# Patient Record
Sex: Female | Born: 1965 | Race: Black or African American | Hispanic: No | Marital: Single | Smoking: Former smoker
Health system: Southern US, Community
[De-identification: ages and names within clinical notes are randomized; demographics above are authoritative.]

## PROBLEM LIST (undated history)

## (undated) DIAGNOSIS — E119 Type 2 diabetes mellitus without complications: Secondary | ICD-10-CM

## (undated) DIAGNOSIS — N200 Calculus of kidney: Secondary | ICD-10-CM

## (undated) HISTORY — PX: TUBAL LIGATION: SHX77

## (undated) HISTORY — PX: LITHOTRIPSY: SUR834

---

## 2007-06-19 ENCOUNTER — Ambulatory Visit (HOSPITAL_COMMUNITY): Admission: RE | Admit: 2007-06-19 | Discharge: 2007-06-19 | Payer: Self-pay | Admitting: Family Medicine

## 2010-12-08 ENCOUNTER — Encounter: Payer: Self-pay | Admitting: *Deleted

## 2010-12-08 ENCOUNTER — Emergency Department (HOSPITAL_COMMUNITY)
Admission: EM | Admit: 2010-12-08 | Discharge: 2010-12-09 | Disposition: A | Discharge status: Home / Self Care | Payer: Self-pay | Attending: Emergency Medicine | Admitting: Emergency Medicine

## 2010-12-08 ENCOUNTER — Emergency Department (HOSPITAL_COMMUNITY): Payer: Self-pay

## 2010-12-08 DIAGNOSIS — F17200 Nicotine dependence, unspecified, uncomplicated: Secondary | ICD-10-CM | POA: Insufficient documentation

## 2010-12-08 DIAGNOSIS — R1011 Right upper quadrant pain: Secondary | ICD-10-CM

## 2010-12-08 DIAGNOSIS — R1084 Generalized abdominal pain: Secondary | ICD-10-CM | POA: Insufficient documentation

## 2010-12-08 DIAGNOSIS — R109 Unspecified abdominal pain: Secondary | ICD-10-CM

## 2010-12-08 DIAGNOSIS — R112 Nausea with vomiting, unspecified: Secondary | ICD-10-CM | POA: Insufficient documentation

## 2010-12-08 DIAGNOSIS — N200 Calculus of kidney: Secondary | ICD-10-CM | POA: Insufficient documentation

## 2010-12-08 DIAGNOSIS — R079 Chest pain, unspecified: Secondary | ICD-10-CM | POA: Insufficient documentation

## 2010-12-08 LAB — URINALYSIS, ROUTINE W REFLEX MICROSCOPIC
Glucose, UA: NEGATIVE mg/dL
Ketones, ur: NEGATIVE mg/dL
pH: 6.5 (ref 5.0–8.0)

## 2010-12-08 LAB — COMPREHENSIVE METABOLIC PANEL
Albumin: 3.8 g/dL (ref 3.5–5.2)
BUN: 9 mg/dL (ref 6–23)
Chloride: 103 mEq/L (ref 96–112)
Creatinine, Ser: 0.9 mg/dL (ref 0.50–1.10)
Total Bilirubin: 0.2 mg/dL — ABNORMAL LOW (ref 0.3–1.2)

## 2010-12-08 LAB — DIFFERENTIAL
Basophils Relative: 0 % (ref 0–1)
Eosinophils Absolute: 0.2 10*3/uL (ref 0.0–0.7)
Monocytes Absolute: 0.5 10*3/uL (ref 0.1–1.0)
Monocytes Relative: 5 % (ref 3–12)
Neutro Abs: 5.3 10*3/uL (ref 1.7–7.7)

## 2010-12-08 LAB — CBC
HCT: 37.4 % (ref 36.0–46.0)
Hemoglobin: 12.5 g/dL (ref 12.0–15.0)
MCH: 29.8 pg (ref 26.0–34.0)
MCHC: 33.4 g/dL (ref 30.0–36.0)
MCV: 89 fL (ref 78.0–100.0)

## 2010-12-08 LAB — URINE MICROSCOPIC-ADD ON

## 2010-12-08 LAB — LIPASE, BLOOD: Lipase: 16 U/L (ref 11–59)

## 2010-12-08 MED ORDER — ONDANSETRON HCL 4 MG/2ML IJ SOLN
4.0000 mg | Freq: Once | INTRAMUSCULAR | Status: AC
  Administered 2010-12-08: 4 mg via INTRAVENOUS
  Filled 2010-12-08: qty 2

## 2010-12-08 MED ORDER — SODIUM CHLORIDE 0.9 % IV SOLN
INTRAVENOUS | Status: DC
  Administered 2010-12-08: 22:00:00 via INTRAVENOUS

## 2010-12-08 MED ORDER — MORPHINE SULFATE 4 MG/ML IJ SOLN
4.0000 mg | Freq: Once | INTRAMUSCULAR | Status: AC
  Administered 2010-12-08: 4 mg via INTRAVENOUS
  Filled 2010-12-08: qty 1

## 2010-12-08 MED ORDER — FAMOTIDINE IN NACL 20-0.9 MG/50ML-% IV SOLN
20.0000 mg | Freq: Once | INTRAVENOUS | Status: DC
  Filled 2010-12-08: qty 50

## 2010-12-08 NOTE — ED Provider Notes (Signed)
History    Scribed for Felisa Bonier, MD, the patient was seen in room APA06/APA06. This chart was scribed by Katha Cabal.   CSN: [id number] Arrival date & time: 12/08/2010  8:47 PM   First MD Initiated Contact with Patient 12/08/10 2105      Chief Complaint  Patient presents with  . Abdominal Pain    (Consider location/radiation/quality/duration/timing/severity/associated sxs/prior treatment) HPI Traci Zimmerman is a 45 y.o. female who presents to the Emergency Department complaining of sudden onset of moderate persistent abdominal pain that radiates to back.  Pain began yesterday.   Pain is described as cramping and sharp.  Pain is associated with nausea, mild vomiting and mild chest pain.  Denies diarrhea, SOB and cough.   Patient states that pain was "unbearable today".  Last BM was 2 days ago which is baseline per patient.  Patient denies dysuria or vaginal discharge.  History reviewed. No pertinent past medical history.  Past Surgical History  Procedure Date  . Cesarean section   . Tubal ligation     Family History  Problem Relation Age of Onset  . Diabetes Neg Hx     History  Substance Use Topics  . Smoking status: Current Everyday Smoker  . Smokeless tobacco: Not on file  . Alcohol Use: No    OB History    Grav Para Term Preterm Abortions TAB SAB Ect Mult Living                  Review of Systems 10 Systems reviewed and are negative for acute change except as noted in the HPI.  Allergies  Review of patient's allergies indicates no known allergies.  Home Medications  No current outpatient prescriptions on file.  BP 147/73  Pulse 64  Temp(Src) 98.5 F (36.9 C) (Oral)  Resp 14  Ht 5\' 7"  (1.702 m)  Wt 234 lb (106.142 kg)  BMI 36.65 kg/m2  SpO2 100%  LMP 12/03/2010  Physical Exam  Nursing note and vitals reviewed. Constitutional: She is oriented to person, place, and time. She appears well-developed and well-nourished.  HENT:  Head:  Normocephalic and atraumatic.  Mouth/Throat: Mucous membranes are normal. Mucous membranes are not dry. No posterior oropharyngeal erythema.  Eyes: EOM are normal.  Neck: Neck supple.  Cardiovascular: Normal rate, regular rhythm and normal heart sounds.   No murmur heard. Pulmonary/Chest: Effort normal and breath sounds normal. No respiratory distress. She has no wheezes. She has no rales.  Abdominal: Soft. Bowel sounds are normal. There is tenderness. There is no rebound and no guarding.       Diffuse upper abdominal tenderness   Musculoskeletal: Normal range of motion.  Neurological: She is alert and oriented to person, place, and time. No sensory deficit.  Skin: Skin is warm, dry and intact. No pallor.  Psychiatric: She has a normal mood and affect. Her behavior is normal.    ED Course  Procedures (including critical care time)   DIAGNOSTIC STUDIES: Oxygen Saturation is 100% on room air, normal by my interpretation.    COORDINATION OF CARE:   LABS / RADIOLOGY:  Labs Reviewed  URINALYSIS, ROUTINE W REFLEX MICROSCOPIC - Abnormal; Notable for the following:    Appearance CLOUDY (*)    Hgb urine dipstick LARGE (*)    Bilirubin Urine SMALL (*)    Protein, ur 30 (*)    Leukocytes, UA TRACE (*)    All other components within normal limits  COMPREHENSIVE METABOLIC PANEL - Abnormal; Notable for  the following:    Potassium 3.3 (*)    Glucose, Bld 120 (*)    Calcium 11.9 (*)    Alkaline Phosphatase 194 (*)    Total Bilirubin 0.2 (*)    GFR calc non Af Amer 76 (*)    GFR calc Af Amer 88 (*)    All other components within normal limits  URINE MICROSCOPIC-ADD ON - Abnormal; Notable for the following:    Squamous Epithelial / LPF FEW (*)    Bacteria, UA FEW (*)    Crystals CA OXALATE CRYSTALS (*)    All other components within normal limits  PREGNANCY, URINE  CBC  DIFFERENTIAL  LIPASE, BLOOD   Results for orders placed during the hospital encounter of 12/08/10  PREGNANCY,  URINE      Component Value Range   Preg Test, Ur NEGATIVE    URINALYSIS, ROUTINE W REFLEX MICROSCOPIC      Component Value Range   Color, Urine YELLOW  YELLOW    Appearance CLOUDY (*) CLEAR    Specific Gravity, Urine 1.025  1.005 - 1.030    pH 6.5  5.0 - 8.0    Glucose, UA NEGATIVE  NEGATIVE (mg/dL)   Hgb urine dipstick LARGE (*) NEGATIVE    Bilirubin Urine SMALL (*) NEGATIVE    Ketones, ur NEGATIVE  NEGATIVE (mg/dL)   Protein, ur 30 (*) NEGATIVE (mg/dL)   Urobilinogen, UA 1.0  0.0 - 1.0 (mg/dL)   Nitrite NEGATIVE  NEGATIVE    Leukocytes, UA TRACE (*) NEGATIVE   CBC      Component Value Range   WBC 8.9  4.0 - 10.5 (K/uL)   RBC 4.20  3.87 - 5.11 (MIL/uL)   Hemoglobin 12.5  12.0 - 15.0 (g/dL)   HCT 81.1  91.4 - 78.2 (%)   MCV 89.0  78.0 - 100.0 (fL)   MCH 29.8  26.0 - 34.0 (pg)   MCHC 33.4  30.0 - 36.0 (g/dL)   RDW 95.6  21.3 - 08.6 (%)   Platelets 344  150 - 400 (K/uL)  DIFFERENTIAL      Component Value Range   Neutrophils Relative 59  43 - 77 (%)   Neutro Abs 5.3  1.7 - 7.7 (K/uL)   Lymphocytes Relative 33  12 - 46 (%)   Lymphs Abs 3.0  0.7 - 4.0 (K/uL)   Monocytes Relative 5  3 - 12 (%)   Monocytes Absolute 0.5  0.1 - 1.0 (K/uL)   Eosinophils Relative 2  0 - 5 (%)   Eosinophils Absolute 0.2  0.0 - 0.7 (K/uL)   Basophils Relative 0  0 - 1 (%)   Basophils Absolute 0.0  0.0 - 0.1 (K/uL)  COMPREHENSIVE METABOLIC PANEL      Component Value Range   Sodium 137  135 - 145 (mEq/L)   Potassium 3.3 (*) 3.5 - 5.1 (mEq/L)   Chloride 103  96 - 112 (mEq/L)   CO2 27  19 - 32 (mEq/L)   Glucose, Bld 120 (*) 70 - 99 (mg/dL)   BUN 9  6 - 23 (mg/dL)   Creatinine, Ser 5.78  0.50 - 1.10 (mg/dL)   Calcium 46.9 (*) 8.4 - 10.5 (mg/dL)   Total Protein 7.4  6.0 - 8.3 (g/dL)   Albumin 3.8  3.5 - 5.2 (g/dL)   AST 24  0 - 37 (U/L)   ALT 26  0 - 35 (U/L)   Alkaline Phosphatase 194 (*) 39 - 117 (U/L)   Total  Bilirubin 0.2 (*) 0.3 - 1.2 (mg/dL)   GFR calc non Af Amer 76 (*) >90 (mL/min)   GFR  calc Af Amer 88 (*) >90 (mL/min)  LIPASE, BLOOD      Component Value Range   Lipase 16  11 - 59 (U/L)  URINE MICROSCOPIC-ADD ON      Component Value Range   Squamous Epithelial / LPF FEW (*) RARE    WBC, UA 7-10  <3 (WBC/hpf)   RBC / HPF 11-20  <3 (RBC/hpf)   Bacteria, UA FEW (*) RARE    Crystals CA OXALATE CRYSTALS (*) NEGATIVE      Dg Abd Acute W/chest  12/08/2010  *RADIOLOGY REPORT*  Clinical Data: Abdominal pain with nausea and vomiting.  Evaluate for bowel obstruction.  ACUTE ABDOMEN SERIES (ABDOMEN 2 VIEW & CHEST 1 VIEW)  Comparison: None.  Findings: Normal heart size with clear lung fields.  No bony abnormality.  No free air.  Nonobstructive gas pattern.  Flocculent material throughout the colon may suggest previous barium study or radiodense tablet ingestion.  Ovoid 4 x 6 radiodensity just to the left of L1-L2 could represent a renal calcification or represent additional ingested radiodense material.  No bony abnormality.  IMPRESSION: No active cardiopulmonary disease.  Nonobstructive gas pattern.  See comments above.  Original Report Authenticated By: Elsie Stain, M.D.         MDM   MDM: The patient has right upper quadrant discomfort and tenderness to palpation with an elevated alkaline phosphatase but normal liver transaminases, and no leukocytosis or fever. I do not suspect acute cholecystitis, however cholelithiasis or choledocholithiasis is possible. Additionally, if she is seen to have hemoglobin and red cells in her urine with calcium oxalate crystals suggestive of possible renal or ureteric stone. I will obtain a CT scan to evaluate these findings further.     MEDICATIONS GIVEN IN THE E.D. Scheduled Meds:    .  morphine injection  4 mg Intravenous Once  . ondansetron (ZOFRAN) IV  4 mg Intravenous Once   Continuous Infusions:    . sodium chloride 125 mL/hr at 12/08/10 2217  . famotidine Stopped (12/08/10 2217)     DDX: Gastritis, gastroenteritis from  viral infection, UTI, cholecystolithiasis, cholecystitis, pancreatitis, bowel obstruction and constipation are considered as differential diagnosis.   IMPRESSION: No diagnosis found.   DISCHARGE MEDICATIONS: New Prescriptions   No medications on file      I personally performed the services described in this documentation, which was scribed in my presence. The recorded information has been reviewed and considered.            Felisa Bonier, MD 12/09/10 [phone]

## 2010-12-08 NOTE — ED Notes (Signed)
abd pain since yesterday, nausea , no vomiting or diarrhea.

## 2010-12-09 ENCOUNTER — Emergency Department (HOSPITAL_COMMUNITY): Payer: Self-pay

## 2010-12-09 MED ORDER — IOHEXOL 300 MG/ML  SOLN
100.0000 mL | Freq: Once | INTRAMUSCULAR | Status: AC | PRN
  Administered 2010-12-09: 100 mL via INTRAVENOUS

## 2010-12-09 MED ORDER — IBUPROFEN 800 MG PO TABS: 800.0000 mg | ORAL_TABLET | Freq: Three times a day (TID) | ORAL | Status: AC

## 2010-12-09 MED ORDER — PROMETHAZINE HCL 25 MG PO TABS: 25.0000 mg | ORAL_TABLET | Freq: Four times a day (QID) | ORAL | Status: AC | PRN

## 2010-12-09 MED ORDER — TAMSULOSIN HCL 0.4 MG PO CAPS: 0.4000 mg | ORAL_CAPSULE | Freq: Two times a day (BID) | ORAL | Status: DC

## 2010-12-09 MED ORDER — HYDROCODONE-ACETAMINOPHEN 5-500 MG PO TABS: 1.0000 | ORAL_TABLET | Freq: Four times a day (QID) | ORAL | Status: AC | PRN

## 2010-12-09 MED ORDER — KETOROLAC TROMETHAMINE 30 MG/ML IJ SOLN
30.0000 mg | Freq: Once | INTRAMUSCULAR | Status: AC
  Administered 2010-12-09: 30 mg via INTRAVENOUS
  Filled 2010-12-09: qty 1

## 2010-12-09 NOTE — ED Notes (Signed)
Patient has ongoing mild pain at this time. She has a diagnosis of a kidney stone on the left which is causing moderate hydronephrosis. She states that her pain is controlled enough that she wants to go home and will followup with a urologist should she need it. I will give her Toradol prior to discharge and have her take medications such as Flomax at home to help pass the stone. I have given her adequate followup for the urologist should she need it. She has expressed her understanding for indications for return.  Vida Roller, MD 12/09/10 [phone]

## 2011-05-17 ENCOUNTER — Encounter (HOSPITAL_COMMUNITY): Payer: Self-pay | Admitting: Emergency Medicine

## 2011-05-17 DIAGNOSIS — F17200 Nicotine dependence, unspecified, uncomplicated: Secondary | ICD-10-CM | POA: Insufficient documentation

## 2011-05-17 DIAGNOSIS — R109 Unspecified abdominal pain: Secondary | ICD-10-CM | POA: Insufficient documentation

## 2011-05-17 DIAGNOSIS — Z87442 Personal history of urinary calculi: Secondary | ICD-10-CM | POA: Insufficient documentation

## 2011-05-17 DIAGNOSIS — N12 Tubulo-interstitial nephritis, not specified as acute or chronic: Secondary | ICD-10-CM | POA: Insufficient documentation

## 2011-05-17 NOTE — ED Notes (Signed)
Pt reports rt sided flank pain starting 4 days ago and worsening tonight, pmx kidney stones

## 2011-05-18 ENCOUNTER — Emergency Department (HOSPITAL_COMMUNITY)
Admission: EM | Admit: 2011-05-18 | Discharge: 2011-05-18 | Disposition: A | Discharge status: Home / Self Care | Payer: Self-pay | Attending: Emergency Medicine | Admitting: Emergency Medicine

## 2011-05-18 ENCOUNTER — Emergency Department (HOSPITAL_COMMUNITY): Payer: Self-pay

## 2011-05-18 DIAGNOSIS — N12 Tubulo-interstitial nephritis, not specified as acute or chronic: Secondary | ICD-10-CM

## 2011-05-18 HISTORY — DX: Calculus of kidney: N20.0

## 2011-05-18 LAB — URINE MICROSCOPIC-ADD ON

## 2011-05-18 LAB — URINALYSIS, ROUTINE W REFLEX MICROSCOPIC
Bilirubin Urine: NEGATIVE
Glucose, UA: NEGATIVE mg/dL
Specific Gravity, Urine: 1.025 (ref 1.005–1.030)
pH: 6 (ref 5.0–8.0)

## 2011-05-18 MED ORDER — HYDROMORPHONE HCL PF 1 MG/ML IJ SOLN
1.0000 mg | Freq: Once | INTRAMUSCULAR | Status: AC
  Administered 2011-05-18: 1 mg via INTRAVENOUS
  Filled 2011-05-18: qty 1

## 2011-05-18 MED ORDER — KETOROLAC TROMETHAMINE 30 MG/ML IJ SOLN
30.0000 mg | Freq: Once | INTRAMUSCULAR | Status: AC
  Administered 2011-05-18: 30 mg via INTRAVENOUS
  Filled 2011-05-18: qty 1

## 2011-05-18 MED ORDER — DEXTROSE 5 % IV SOLN
1.0000 g | Freq: Once | INTRAVENOUS | Status: AC
  Administered 2011-05-18: 1 g via INTRAVENOUS
  Filled 2011-05-18: qty 10

## 2011-05-18 MED ORDER — NITROFURANTOIN MONOHYD MACRO 100 MG PO CAPS: 100.0000 mg | ORAL_CAPSULE | Freq: Two times a day (BID) | ORAL | Status: AC

## 2011-05-18 MED ORDER — ONDANSETRON HCL 4 MG/2ML IJ SOLN
4.0000 mg | Freq: Once | INTRAMUSCULAR | Status: AC
  Administered 2011-05-18: 4 mg via INTRAVENOUS
  Filled 2011-05-18: qty 2

## 2011-05-18 MED ORDER — ONDANSETRON HCL 8 MG PO TABS: 8.0000 mg | ORAL_TABLET | ORAL | Status: AC | PRN

## 2011-05-18 MED ORDER — SODIUM CHLORIDE 0.9 % IV BOLUS (SEPSIS)
1000.0000 mL | Freq: Once | INTRAVENOUS | Status: AC
  Administered 2011-05-18: 1000 mL via INTRAVENOUS

## 2011-05-18 MED ORDER — OXYCODONE-ACETAMINOPHEN 5-325 MG PO TABS: 2.0000 | ORAL_TABLET | ORAL | Status: AC | PRN

## 2011-05-18 NOTE — Discharge Instructions (Signed)
Pyelonephritis, Adult Pyelonephritis is a kidney infection. A kidney infection can happen quickly, or it can last for a long time. HOME CARE   Take your medicine (antibiotics) as told. Finish it even if you start to feel better.   Keep all doctor visits as told.   Drink enough fluids to keep your pee (urine) clear or pale yellow.   Only take medicine as told by your doctor.  GET HELP RIGHT AWAY IF:   You have a fever.   You cannot take your medicine or drink fluids as told.   You have chills and shaking.   You feel very weak or pass out (faint).   You do not feel better after 2 days.  MAKE SURE YOU:  Understand these instructions.   Will watch your condition.   Will get help right away if you are not doing well or get worse.  Document Released: 03/09/2004 Document Revised: 01/19/2011 Document Reviewed: 07/20/2010 [facility] Patient Information 2012 [place], [place].  You have a kidney infection called pyelonephritis. Increase fluids. Antibiotic for 10 days. Medication for pain and nausea. Return if worse.

## 2011-06-01 ENCOUNTER — Encounter (HOSPITAL_COMMUNITY): Payer: Self-pay | Admitting: *Deleted

## 2011-06-01 ENCOUNTER — Emergency Department (HOSPITAL_COMMUNITY): Payer: Self-pay

## 2011-06-01 ENCOUNTER — Emergency Department (HOSPITAL_COMMUNITY)
Admission: EM | Admit: 2011-06-01 | Discharge: 2011-06-02 | Disposition: A | Discharge status: Home / Self Care | Payer: Self-pay | Attending: Emergency Medicine | Admitting: Emergency Medicine

## 2011-06-01 DIAGNOSIS — F17200 Nicotine dependence, unspecified, uncomplicated: Secondary | ICD-10-CM | POA: Insufficient documentation

## 2011-06-01 DIAGNOSIS — R350 Frequency of micturition: Secondary | ICD-10-CM | POA: Insufficient documentation

## 2011-06-01 DIAGNOSIS — N200 Calculus of kidney: Secondary | ICD-10-CM | POA: Insufficient documentation

## 2011-06-01 DIAGNOSIS — R109 Unspecified abdominal pain: Secondary | ICD-10-CM | POA: Insufficient documentation

## 2011-06-01 DIAGNOSIS — N23 Unspecified renal colic: Secondary | ICD-10-CM

## 2011-06-01 DIAGNOSIS — R319 Hematuria, unspecified: Secondary | ICD-10-CM | POA: Insufficient documentation

## 2011-06-01 MED ORDER — HYDROMORPHONE HCL PF 1 MG/ML IJ SOLN
1.0000 mg | Freq: Once | INTRAMUSCULAR | Status: AC
  Administered 2011-06-02: 1 mg via INTRAVENOUS
  Filled 2011-06-01: qty 1

## 2011-06-01 MED ORDER — SODIUM CHLORIDE 0.9 % IV SOLN
Freq: Once | INTRAVENOUS | Status: AC
  Administered 2011-06-02: 20 mL/h via INTRAVENOUS

## 2011-06-01 MED ORDER — ONDANSETRON HCL 4 MG/2ML IJ SOLN
4.0000 mg | Freq: Once | INTRAMUSCULAR | Status: AC
  Administered 2011-06-02: 4 mg via INTRAVENOUS
  Filled 2011-06-01: qty 2

## 2011-06-01 NOTE — ED Notes (Signed)
Bilateral flank pain, recently treated for kidney stones

## 2011-06-02 LAB — URINALYSIS, ROUTINE W REFLEX MICROSCOPIC
Ketones, ur: NEGATIVE mg/dL
Leukocytes, UA: NEGATIVE
Nitrite: NEGATIVE
Protein, ur: NEGATIVE mg/dL

## 2011-06-02 MED ORDER — OXYCODONE-ACETAMINOPHEN 5-325 MG PO TABS: 1.0000 | ORAL_TABLET | ORAL | Status: AC | PRN

## 2011-06-02 MED ORDER — DOCUSATE SODIUM 100 MG PO CAPS: 100.0000 mg | ORAL_CAPSULE | Freq: Two times a day (BID) | ORAL | Status: AC

## 2011-06-02 MED ORDER — IBUPROFEN 800 MG PO TABS: 800.0000 mg | ORAL_TABLET | Freq: Three times a day (TID) | ORAL | Status: AC

## 2011-06-02 MED ORDER — CEPHALEXIN 500 MG PO CAPS
500.0000 mg | ORAL_CAPSULE | Freq: Once | ORAL | Status: AC
  Administered 2011-06-02: 500 mg via ORAL
  Filled 2011-06-02: qty 1

## 2011-06-02 MED ORDER — CEPHALEXIN 500 MG PO CAPS: 500.0000 mg | ORAL_CAPSULE | Freq: Four times a day (QID) | ORAL | Status: AC

## 2011-06-02 NOTE — ED Provider Notes (Signed)
History     CSN: [id number]  Arrival date & time 06/01/11  2226   First MD Initiated Contact with Patient 06/01/11 2346      Chief Complaint  Patient presents with  . Flank Pain    (Consider location/radiation/quality/duration/timing/severity/associated sxs/prior treatment) HPI Comments: Patient states she has been diagnosed with bilateral kidney stones since April 4. She recently finished the medication and antibiotics. The patient states that she has some mild pain in her flank area cold the right at the level. Today while at work she had a" sharp pain" with right middle probe was solution that she had to stop work [employer] for additional evaluation. She denies any recent fevers. She states that her urine has her usual which is not seen blood. She has not been seen by urology at this point.  The history is provided by the patient.    Past Medical History  Diagnosis Date  . Kidney stone     Past Surgical History  Procedure Date  . Cesarean section   . Tubal ligation     Family History  Problem Relation Age of Onset  . Diabetes Neg Hx     History  Substance Use Topics  . Smoking status: Current Everyday Smoker  . Smokeless tobacco: Not on file  . Alcohol Use: No    OB History    Grav Para Term Preterm Abortions TAB SAB Ect Mult Living                  Review of Systems  Constitutional: Negative for activity change.       All ROS Neg except as noted in HPI  HENT: Negative for nosebleeds and neck pain.   Eyes: Negative for photophobia and discharge.  Respiratory: Negative for cough, shortness of breath and wheezing.   Cardiovascular: Negative for chest pain and palpitations.  Gastrointestinal: Negative for vomiting, abdominal pain and blood in stool.       Flank pain  Genitourinary: Positive for frequency. Negative for dysuria and hematuria.  Musculoskeletal: Negative for back pain and arthralgias.  Skin: Negative.   Neurological: Negative for  dizziness, seizures and speech difficulty.  Psychiatric/Behavioral: Negative for hallucinations and confusion.    Allergies  Review of patient's allergies indicates no known allergies.  Home Medications   Current Outpatient Rx  Name Route Sig Dispense Refill  . TAMSULOSIN HCL 0.4 MG PO CAPS Oral Take 1 capsule (0.4 mg total) by mouth 2 (two) times daily. 10 capsule 0    BP 146/82  Pulse 71  Temp 97.8 F (36.6 C)  Resp 20  Ht 5\' 8"  (1.727 m)  Wt 228 lb (103.42 kg)  BMI 34.67 kg/m2  SpO2 95%  LMP 05/26/2011  Physical Exam  Nursing note and vitals reviewed. Constitutional: She is oriented to person, place, and time. She appears well-developed and well-nourished.  Non-toxic appearance.  HENT:  Head: Normocephalic.  Right Ear: Tympanic membrane and external ear normal.  Left Ear: Tympanic membrane and external ear normal.  Eyes: EOM and lids are normal. Pupils are equal, round, and reactive to light.  Neck: Normal range of motion. Neck supple. Carotid bruit is not present.  Cardiovascular: Normal rate, regular rhythm, normal heart sounds, intact distal pulses and normal pulses.   Pulmonary/Chest: Breath sounds normal. No respiratory distress.  Abdominal: Soft. Bowel sounds are normal. There is no guarding.       Mild to moderate lateral CVA tenderness. Mild suprapubic tenderness.  Musculoskeletal: Normal range of  motion.  Lymphadenopathy:       Head (right side): No submandibular adenopathy present.       Head (left side): No submandibular adenopathy present.    She has no cervical adenopathy.  Neurological: She is alert and oriented to person, place, and time. She has normal strength. No cranial nerve deficit or sensory deficit.  Skin: Skin is warm and dry.  Psychiatric: She has a normal mood and affect. Her speech is normal.    ED Course  Procedures (including critical care time)  Labs Reviewed  URINALYSIS, ROUTINE W REFLEX MICROSCOPIC - Abnormal; Notable for the  following:    APPearance HAZY (*)    Hgb urine dipstick SMALL (*)    All other components within normal limits  URINE MICROSCOPIC-ADD ON - Abnormal; Notable for the following:    Squamous Epithelial / LPF MANY (*)    Bacteria, UA MANY (*)    Crystals CA OXALATE CRYSTALS (*)    All other components within normal limits  URINE CULTURE   No results found.   No diagnosis found.    MDM  I have reviewed nursing notes, vital signs, and all appropriate lab and imaging results for this patient. And a The urine test reveals many bacteria, calcium crystals and hematuria. There are many squamous cells present. Culture sent to the lab. Pain improved after IV pain meds. No significant change in position of the kidney stones on 2 view xray. Plan for urology outpt consult. Keflex and oxycodone given. Will use colace while on the percocet. Pt  to return if any changes or problem.      Kathie Dike, [place] 06/02/11 [phone]

## 2011-06-02 NOTE — ED Provider Notes (Signed)
Medical screening examination/treatment/procedure(s) were conducted as a shared visit with non-physician practitioner(s) and myself.  I personally evaluated the patient during the encounter  Please see my separate respective documentation pertaining to this patient encounter   Vida Roller, MD 06/02/11 [phone]

## 2011-06-02 NOTE — ED Notes (Signed)
46 year old female with a history of nephrolithiasis who presents with sharp abdominal pain which started acutely this afternoon. This is on top of some chronic abdominal pain that she has had for the last several weeks. She's had a recent CT scan showing a 7 mm kidney stone as well as multiple punctate kidney stones bilaterally.  Physical exam shows that she has a soft and nontender abdomen, clear heart and lungs and no peripheral edema.  Assessment, patient has known kidney stones, has oxalate crystals as well as hematuria and white blood cells with some bacteria as well as some squamous cells. Urine culture sent as the sample appears contaminated, was sent home on Keflex, pain medication, stool softener and phone number for urology given to the patient. I've explained her findings and indications for followup and she hasagreedwith the same plan.  Medical screening examination/treatment/procedure(s) were conducted as a shared visit with non-physician practitioner(s) and myself.  I personally evaluated the patient during the encounter   Vida Roller, MD 06/02/11 [phone]

## 2011-06-02 NOTE — Discharge Instructions (Signed)
Your xray done tonight does not show significant movement of the stones in your kidneys. Please call Dr Jerre Simon for urology evaluation of your kidney stones and follow up of your urine. Stool sofener to be used while on narcotics.Ureteral Colic Ureteral colic is spasm-like pain from the kidney or the ureter. This is often caused by a kidney stone. The pain is caused by the stone trying to get through the tubes that pass your pee. HOME CARE   Drink enough fluids to keep your pee (urine) clear or pale yellow.   Strain all your pee. A strainer will be provided. Keep anything caught in the strainer and bring it to your doctor. The stone causing the pain may be very small.   Only take medicine as told by your doctor.   Follow up with your doctor as told.  GET HELP RIGHT AWAY IF:   Pain is not controlled with medicine.   Pain continues or gets worse.   The pain changes and there is chest or belly (abdominal) pain.   You pass out (faint).   You cannot pee.   You keep throwing up (vomiting).   You have a temperature by mouth above 102 F (38.9 C), not controlled by medicine.  MAKE SURE YOU:   Understand these instructions.   Will watch this condition.   Will get help right away if you are not doing well or get worse.  Document Released: 07/19/2007 Document Revised: 01/19/2011 Document Reviewed: 07/19/2007 [facility] Patient Information 2012 [place], [place].

## 2011-06-03 LAB — URINE CULTURE
Culture  Setup Time: 201304190430
Special Requests: NORMAL

## 2011-06-03 NOTE — ED Provider Notes (Signed)
History     CSN: [id number]  Arrival date & time 05/17/11  2338   First MD Initiated Contact with Patient 05/18/11 0033      Chief Complaint  Patient presents with  . Flank Pain    (Consider location/radiation/quality/duration/timing/severity/associated sxs/prior treatment) HPI..... right flank pain for 4 days, getting worse. No fever sweats or chills.  No obvious dysuria. Past medical history positive for kidney stones. Nothing makes symptoms better or worse. Described as moderate.  Past Medical History  Diagnosis Date  . Kidney stone     Past Surgical History  Procedure Date  . Cesarean section   . Tubal ligation     Family History  Problem Relation Age of Onset  . Diabetes Neg Hx     History  Substance Use Topics  . Smoking status: Current Everyday Smoker  . Smokeless tobacco: Not on file  . Alcohol Use: No    OB History    Grav Para Term Preterm Abortions TAB SAB Ect Mult Living                  Review of Systems  All other systems reviewed and are negative.    Allergies  Review of patient's allergies indicates no known allergies.  Home Medications   Current Outpatient Rx  Name Route Sig Dispense Refill  . CEPHALEXIN 500 MG PO CAPS Oral Take 1 capsule (500 mg total) by mouth 4 (four) times daily. 20 capsule 0  . DOCUSATE SODIUM 100 MG PO CAPS Oral Take 1 capsule (100 mg total) by mouth every 12 (twelve) hours. 30 capsule 0  . IBUPROFEN 800 MG PO TABS Oral Take 1 tablet (800 mg total) by mouth 3 (three) times daily. 21 tablet 0  . OXYCODONE-ACETAMINOPHEN 5-325 MG PO TABS Oral Take 1 tablet by mouth every 4 (four) hours as needed for pain. 20 tablet 0  . TAMSULOSIN HCL 0.4 MG PO CAPS Oral Take 1 capsule (0.4 mg total) by mouth 2 (two) times daily. 10 capsule 0    BP 146/72  Pulse 68  Temp(Src) 97.9 F (36.6 C) (Oral)  Resp 20  Ht 5\' 7"  (1.702 m)  Wt 232 lb (105.235 kg)  BMI 36.34 kg/m2  SpO2 100%  LMP 04/29/2011  Physical Exam  Nursing  note and vitals reviewed. Constitutional: She is oriented to person, place, and time. She appears well-developed and well-nourished.  HENT:  Head: Normocephalic and atraumatic.  Eyes: Conjunctivae and EOM are normal. Pupils are equal, round, and reactive to light.  Neck: Normal range of motion. Neck supple.  Cardiovascular: Normal rate and regular rhythm.   Pulmonary/Chest: Effort normal and breath sounds normal.  Abdominal: Soft. Bowel sounds are normal.  Genitourinary:       Slight right flank  Musculoskeletal: Normal range of motion.  Neurological: She is alert and oriented to person, place, and time.  Skin: Skin is warm and dry.  Psychiatric: She has a normal mood and affect.    ED Course  Procedures (including critical care time)  Labs Reviewed  URINALYSIS, ROUTINE W REFLEX MICROSCOPIC - Abnormal; Notable for the following:    Hgb urine dipstick TRACE (*)    All other components within normal limits  URINE MICROSCOPIC-ADD ON - Abnormal; Notable for the following:    Squamous Epithelial / LPF MANY (*)    Bacteria, UA FEW (*)    Crystals CA OXALATE CRYSTALS (*)    All other components within normal limits  PREGNANCY, URINE  URINE  CULTURE  LAB REPORT - SCANNED   Dg Abd 2 Views  06/02/2011  *RADIOLOGY REPORT*  Clinical Data: Bilateral flank pain; recently treated for renal stones.  ABDOMEN - 2 VIEW  Comparison: Abdominal radiograph performed 12/08/2010, and CT of the abdomen and pelvis performed 05/18/2011  Findings: The dominant 1.2 cm stone at the left renal pelvis is grossly unchanged in position.  Additional tiny nonobstructing stones within the kidneys are not well characterized on this study due to their size.  There is no definite evidence for an obstructing ureteral stone.  The visualized bowel gas pattern is unremarkable.  Scattered air and stool filled loops of colon are seen; no abnormal dilatation of small bowel loops is seen to suggest small bowel obstruction.  No free  intra-abdominal air is identified on the provided upright view.  The visualized osseous structures are within normal limits; the sacroiliac joints are unremarkable in appearance.  The visualized lung bases are essentially clear.  IMPRESSION:  1.  No definite evidence for obstructing ureteral stone. 2.  Dominant 1.2 cm stone at the left renal pelvis is grossly unchanged in position, still seen overlying the left renal pelvis. This did not cause obstruction on the prior CT. 3.  Unremarkable bowel gas pattern; no free intra-abdominal air seen.  Original Report Authenticated By: Tonia Ghent, M.D.     1. Pyelonephritis       MDM  History and physical more consistent with pyelonephritis than kidney stone. Patient is hemodynamically stable. Will Rx with IV Rocephin, hydrate patient.  CT scan negative for ureteral stone.  Patient can be treated as an outpatient        Donnetta Hutching, MD 06/07/11 [phone]

## 2011-06-26 ENCOUNTER — Encounter (HOSPITAL_COMMUNITY): Payer: Self-pay | Admitting: *Deleted

## 2011-06-26 ENCOUNTER — Emergency Department (HOSPITAL_COMMUNITY)
Admission: EM | Admit: 2011-06-26 | Discharge: 2011-06-26 | Disposition: A | Discharge status: Home / Self Care | Payer: Self-pay | Attending: Emergency Medicine | Admitting: Emergency Medicine

## 2011-06-26 DIAGNOSIS — R109 Unspecified abdominal pain: Secondary | ICD-10-CM | POA: Insufficient documentation

## 2011-06-26 DIAGNOSIS — F17200 Nicotine dependence, unspecified, uncomplicated: Secondary | ICD-10-CM | POA: Insufficient documentation

## 2011-06-26 DIAGNOSIS — Z87442 Personal history of urinary calculi: Secondary | ICD-10-CM | POA: Insufficient documentation

## 2011-06-26 LAB — URINALYSIS, ROUTINE W REFLEX MICROSCOPIC
Bilirubin Urine: NEGATIVE
Ketones, ur: NEGATIVE mg/dL
Nitrite: NEGATIVE
Urobilinogen, UA: 0.2 mg/dL (ref 0.0–1.0)

## 2011-06-26 LAB — URINE MICROSCOPIC-ADD ON

## 2011-06-26 MED ORDER — OXYCODONE-ACETAMINOPHEN 5-325 MG PO TABS
2.0000 | ORAL_TABLET | Freq: Once | ORAL | Status: AC
  Administered 2011-06-26: 2 via ORAL
  Filled 2011-06-26: qty 2

## 2011-06-26 MED ORDER — OXYCODONE-ACETAMINOPHEN 5-325 MG PO TABS: 1.0000 | ORAL_TABLET | ORAL | Status: AC | PRN

## 2011-06-26 MED ORDER — ONDANSETRON 8 MG PO TBDP
8.0000 mg | ORAL_TABLET | Freq: Once | ORAL | Status: AC
  Administered 2011-06-26: 8 mg via ORAL
  Filled 2011-06-26: qty 1

## 2011-06-26 MED ORDER — KETOROLAC TROMETHAMINE 60 MG/2ML IM SOLN
60.0000 mg | Freq: Once | INTRAMUSCULAR | Status: AC
  Administered 2011-06-26: 60 mg via INTRAMUSCULAR
  Filled 2011-06-26: qty 2

## 2011-06-26 NOTE — ED Notes (Signed)
Pt. Alert and oriented, resting on stretcher, NAD noted

## 2011-06-26 NOTE — ED Provider Notes (Signed)
History     CSN: [id number]  Arrival date & time 06/26/11  1626   First MD Initiated Contact with Patient 06/26/11 1839      Chief Complaint  Patient presents with  . Flank Pain     Patient is a 46 y.o. female presenting with flank pain. The history is provided by the patient.  Flank Pain This is a recurrent problem. The current episode started 2 days ago. The problem occurs constantly. The problem has been gradually worsening. Associated symptoms include abdominal pain. Pertinent negatives include no headaches and no shortness of breath. Exacerbated by: palpation. The symptoms are relieved by nothing. She has tried rest for the symptoms. The treatment provided no relief.  Pt reports h/o kidney stones She reports that she started having right flank pain that radiates to her lower abdomen for two days Similar to prior episodes of kidney stones She reports vomiting No fever reported She just started her menstrual cycle and reports recent vaginal bleeding but none at this time  Past Medical History  Diagnosis Date  . Kidney stone     Past Surgical History  Procedure Date  . Cesarean section   . Tubal ligation     Family History  Problem Relation Age of Onset  . Diabetes Neg Hx     History  Substance Use Topics  . Smoking status: Current Everyday Smoker  . Smokeless tobacco: Not on file  . Alcohol Use: No    OB History    Grav Para Term Preterm Abortions TAB SAB Ect Mult Living                  Review of Systems  Respiratory: Negative for shortness of breath.   Gastrointestinal: Positive for abdominal pain.  Genitourinary: Positive for flank pain.  Neurological: Negative for headaches.  All other systems reviewed and are negative.    Allergies  Review of patient's allergies indicates no known allergies.  Home Medications   Current Outpatient Rx  Name Route Sig Dispense Refill  . IBUPROFEN 800 MG PO TABS Oral Take 800 mg by mouth as needed. For pain        BP 143/77  Pulse 69  Temp(Src) 98.1 F (36.7 C) (Oral)  Resp 20  Ht 5\' 7"  (1.702 m)  Wt 230 lb (104.327 kg)  BMI 36.02 kg/m2  SpO2 100%  LMP 06/26/2011  Physical Exam CONSTITUTIONAL: Well developed/well nourished HEAD AND FACE: Normocephalic/atraumatic EYES: EOMI/PERRL ENMT: Mucous membranes moist NECK: supple no meningeal signs SPINE:entire spine nontender, No bruising/crepitance/stepoffs noted to spine CV: S1/S2 noted, no murmurs/rubs/gallops noted LUNGS: Lungs are clear to auscultation bilaterally, no apparent distress ABDOMEN: soft, nontender, no rebound or guarding JX:BJYNW cva tenderness NEURO: Pt is awake/alert, moves all extremitiesx4 EXTREMITIES: pulses normal, full ROM SKIN: warm, color normal PSYCH: no abnormalities of mood noted  ED Course  Procedures   Labs Reviewed  URINALYSIS, ROUTINE W REFLEX MICROSCOPIC - Abnormal; Notable for the following:    Hgb urine dipstick MODERATE (*)    Leukocytes, UA TRACE (*)    All other components within normal limits  URINE MICROSCOPIC-ADD ON - Abnormal; Notable for the following:    Squamous Epithelial / LPF FEW (*)    Bacteria, UA FEW (*)    All other components within normal limits  POCT PREGNANCY, URINE  8:19 PM Pt with h/o kidney stones and recent abdominal imaging Will treat pain and reassess  8:40 PM Pt improved No fever, well appearing, no uti She  understands need for urology followup Discussed strict return precautions  The patient appears reasonably screened and/or stabilized for discharge and I doubt any other medical condition or other [facility] requiring further screening, evaluation, or treatment in the ED at this time prior to discharge.    MDM  Nursing notes reviewed and considered in documentation All labs/vitals reviewed and considered Previous records reviewed and considered         Joya Gaskins, MD 06/26/11 2041

## 2011-06-26 NOTE — ED Notes (Addendum)
Pain rt flank for 2 days, Thinks it is a kidney stone.  Vomited  pta

## 2012-08-14 ENCOUNTER — Emergency Department (HOSPITAL_COMMUNITY): Payer: Self-pay

## 2012-08-14 ENCOUNTER — Emergency Department (HOSPITAL_COMMUNITY)
Admission: EM | Admit: 2012-08-14 | Discharge: 2012-08-14 | Disposition: A | Discharge status: Home / Self Care | Payer: Self-pay | Attending: Emergency Medicine | Admitting: Emergency Medicine

## 2012-08-14 ENCOUNTER — Encounter (HOSPITAL_COMMUNITY): Payer: Self-pay | Admitting: *Deleted

## 2012-08-14 DIAGNOSIS — Z87442 Personal history of urinary calculi: Secondary | ICD-10-CM | POA: Insufficient documentation

## 2012-08-14 DIAGNOSIS — E119 Type 2 diabetes mellitus without complications: Secondary | ICD-10-CM | POA: Insufficient documentation

## 2012-08-14 DIAGNOSIS — Z3202 Encounter for pregnancy test, result negative: Secondary | ICD-10-CM | POA: Insufficient documentation

## 2012-08-14 DIAGNOSIS — F17200 Nicotine dependence, unspecified, uncomplicated: Secondary | ICD-10-CM | POA: Insufficient documentation

## 2012-08-14 DIAGNOSIS — Z79899 Other long term (current) drug therapy: Secondary | ICD-10-CM | POA: Insufficient documentation

## 2012-08-14 DIAGNOSIS — Z9889 Other specified postprocedural states: Secondary | ICD-10-CM | POA: Insufficient documentation

## 2012-08-14 DIAGNOSIS — R110 Nausea: Secondary | ICD-10-CM | POA: Insufficient documentation

## 2012-08-14 DIAGNOSIS — R109 Unspecified abdominal pain: Secondary | ICD-10-CM | POA: Insufficient documentation

## 2012-08-14 HISTORY — DX: Type 2 diabetes mellitus without complications: E11.9

## 2012-08-14 LAB — URINALYSIS, ROUTINE W REFLEX MICROSCOPIC
Glucose, UA: NEGATIVE mg/dL
Ketones, ur: NEGATIVE mg/dL
Leukocytes, UA: NEGATIVE
Specific Gravity, Urine: 1.025 (ref 1.005–1.030)
pH: 6 (ref 5.0–8.0)

## 2012-08-14 LAB — CBC WITH DIFFERENTIAL/PLATELET
Eosinophils Relative: 2 % (ref 0–5)
Lymphocytes Relative: 35 % (ref 12–46)
Lymphs Abs: 3.5 10*3/uL (ref 0.7–4.0)
MCV: 80.7 fL (ref 78.0–100.0)
Neutro Abs: 5.8 10*3/uL (ref 1.7–7.7)
Platelets: 293 10*3/uL (ref 150–400)
RBC: 4.29 MIL/uL (ref 3.87–5.11)
WBC: 10 10*3/uL (ref 4.0–10.5)

## 2012-08-14 LAB — BASIC METABOLIC PANEL
CO2: 21 mEq/L (ref 19–32)
Chloride: 105 mEq/L (ref 96–112)
GFR calc Af Amer: >90 mL/min (ref >90)
Potassium: 3.9 mEq/L (ref 3.5–5.1)
Sodium: 136 mEq/L (ref 135–145)

## 2012-08-14 LAB — PREGNANCY, URINE: Preg Test, Ur: NEGATIVE

## 2012-08-14 MED ORDER — NAPROXEN 500 MG PO TABS: 500.0000 mg | ORAL_TABLET | Freq: Two times a day (BID) | ORAL | Status: DC

## 2012-08-14 MED ORDER — KETOROLAC TROMETHAMINE 30 MG/ML IJ SOLN
30.0000 mg | Freq: Once | INTRAMUSCULAR | Status: AC
  Administered 2012-08-14: 30 mg via INTRAVENOUS
  Filled 2012-08-14: qty 1

## 2012-08-14 NOTE — ED Notes (Signed)
Pain lt flank with hx of kidney stones. Nausea,

## 2012-08-14 NOTE — ED Provider Notes (Signed)
History    This chart was scribed for Vida Roller, MD by Quintella Reichert, ED scribe.  This patient was seen in room APA04/[facility]04 and the patient's care was started at 8:40 PM.   CSN: [id number]  Arrival date & time 08/14/12  1750    Chief Complaint  Patient presents with  . Flank Pain    The history is provided by the patient. No language interpreter was used.     HPI Comments: Traci Zimmerman is a 47 y.o. female with h/o kidney stones who presents to the Emergency Department complaining of 2 days of intermittent severe left flank pain.  Pain is described as sharp and stabbing and is localized to the left flank radiating down into the groin.  It is exacerbated by standing and bending   Pt also notes that her stool has been dark and she complains of nausea when her pain is severe.  She denies hematuria.  Pt reports that she had kidney stones 5 months ago and received lithotripsy.  She never passed stones to her knowledge but states that her pain resolved after that treatment.  Since that time she has experienced sporadic mild abdominal pain but did not experience her present level of pain until 2 days ago.     Past Medical History  Diagnosis Date  . Kidney stone   . Diabetes mellitus without complication     Past Surgical History  Procedure Laterality Date  . Cesarean section    . Tubal ligation    . Lithotripsy      Family History  Problem Relation Age of Onset  . Diabetes Neg Hx     History  Substance Use Topics  . Smoking status: Current Every Day Smoker  . Smokeless tobacco: Not on file  . Alcohol Use: No    OB History   Grav Para Term Preterm Abortions TAB SAB Ect Mult Living                   Review of Systems A complete 10 system review of systems was obtained and all systems are negative except as noted in the HPI and PMH.     Allergies  Review of patient's allergies indicates no known allergies.  Home Medications   Current Outpatient Rx   Name  Route  Sig  Dispense  Refill  . metFORMIN (GLUCOPHAGE) 500 MG tablet   Oral   Take 500 mg by mouth 2 (two) times daily.         . naproxen (NAPROSYN) 500 MG tablet   Oral   Take 1 tablet (500 mg total) by mouth 2 (two) times daily with a meal.   30 tablet   0     BP 144/92  Pulse 78  Temp(Src) 98.4 F (36.9 C) (Oral)  Resp 18  Ht 5\' 7"  (1.702 m)  Wt 221 lb (100.245 kg)  BMI 34.61 kg/m2  SpO2 100%  LMP 07/16/2012   Physical Exam  Nursing note and vitals reviewed. Constitutional: She appears well-developed and well-nourished. No distress.  Appears comfortable  HENT:  Head: Normocephalic and atraumatic.  Mouth/Throat: Oropharynx is clear and moist. No oropharyngeal exudate.  Eyes: Conjunctivae and EOM are normal. Pupils are equal, round, and reactive to light. Right eye exhibits no discharge. Left eye exhibits no discharge. No scleral icterus.  Neck: Normal range of motion. Neck supple. No JVD present. No thyromegaly present.  Cardiovascular: Normal rate, regular rhythm, normal heart sounds and intact distal pulses.  Exam reveals no gallop and no friction rub.   No murmur heard. Pulmonary/Chest: Effort normal and breath sounds normal. No respiratory distress. She has no wheezes. She has no rales.  Abdominal: Soft. Bowel sounds are normal. She exhibits no distension and no mass. There is tenderness. There is guarding.  Left flank tender with guarding No other abdominal tenderness  Musculoskeletal: Normal range of motion. She exhibits no edema and no tenderness.  Lymphadenopathy:    She has no cervical adenopathy.  Neurological: She is alert. Coordination normal.  Skin: Skin is warm and dry. No rash noted. No erythema.  Psychiatric: She has a normal mood and affect. Her behavior is normal.     ED Course  Procedures (including critical care time)  DIAGNOSTIC STUDIES: Oxygen Saturation is 100% on room air, normal by my interpretation.    COORDINATION OF  CARE: 8:44 PM: Discussed treatment plan which includes pain medication, labs and abdomen CT.  Pt expressed understanding and agreed to plan.    Labs Reviewed  CBC WITH DIFFERENTIAL - Abnormal; Notable for the following:    Hemoglobin 11.2 (*)    HCT 34.6 (*)    All other components within normal limits  BASIC METABOLIC PANEL - Abnormal; Notable for the following:    Glucose, Bld 110 (*)    Calcium 11.9 (*)    GFR calc non Af Amer 85 (*)    All other components within normal limits  URINALYSIS, ROUTINE W REFLEX MICROSCOPIC  PREGNANCY, URINE    Ct Abdomen Pelvis Wo Contrast  08/14/2012   *RADIOLOGY REPORT*  Clinical Data: Severe left flank pain.  CT ABDOMEN AND PELVIS WITHOUT CONTRAST  Technique:  Multidetector CT imaging of the abdomen and pelvis was performed following the standard protocol without intravenous contrast.  Comparison: CT abdomen and pelvis 04/24/2012.  Findings: The lung bases are clear.  No pleural or pericardial effusion.  The large stone in the left renal pelvis on the comparison study is no longer present.  There is no hydronephrosis and no ureteral stones are identified.  Again seen are bilateral nonobstructing renal stones. The largest is in the lower pole of the left kidney and measures 0.5 cm.  No urinary bladder stones are identified.  The liver is low attenuating consistent with fatty infiltration. The gallbladder is decompressed but otherwise unremarkable.  The adrenal glands and spleen appear normal.  There is some fatty atrophy of the pancreas, unchanged.  Fibroid uterus is identified. The stomach, small and large bowel and appendix appear normal. There is no lymphadenopathy or fluid.  No focal bony abnormality is identified.  IMPRESSION:  1.  Negative for hydronephrosis or ureteral stone.  Bilateral nonobstructing renal stones are identified.  The large stone in the left renal pelvis on the prior study is no longer present. 2.  Fibroid uterus. 3.  Fatty infiltration of  the liver.   Original Report Authenticated By: Holley Dexter, M.D.    1. Abdominal pain     MDM  The patient is well-appearing, she has tenderness to the left flank but no signs on CT scan of any definite abnormalities. There is no hematuria, no urinary infection, labs otherwise unremarkable, CT scan shows no ureterolithiasis or hydronephrosis. She has been given Toradol, she is stable for discharge   Meds given in ED:  Medications  ketorolac (TORADOL) 30 MG/ML injection 30 mg (30 mg Intravenous Given 08/14/12 2105)    New Prescriptions   NAPROXEN (NAPROSYN) 500 MG TABLET    Take  1 tablet (500 mg total) by mouth 2 (two) times daily with a meal.        I personally performed the services described in this documentation, which was scribed in my presence. The recorded information has been reviewed and is accurate.      Vida Roller, MD 08/14/12 2233

## 2012-09-20 ENCOUNTER — Encounter: Payer: Self-pay | Admitting: Cardiology

## 2012-09-20 DIAGNOSIS — R079 Chest pain, unspecified: Secondary | ICD-10-CM

## 2013-09-03 IMAGING — CT CT ABD-PELV W/O CM
2 of 4 series · 17 of 46 positions shown, 19 images · non-contrast
Comparison: CT abdomen and pelvis 04/24/2012.

CLINICAL DATA: Severe left flank pain.

CT ABDOMEN AND PELVIS WITHOUT CONTRAST
TECHNIQUE: Multidetector CT imaging of the abdomen and pelvis was
performed following the standard protocol without intravenous
contrast.

[Series 2: standard/full over (age)lbs 5.0 · axial · 0.89mm/px · z∈[-516,-91]mm · 14 of 93 slices shown, 16 images]
[im 4/93  soft-tissue]
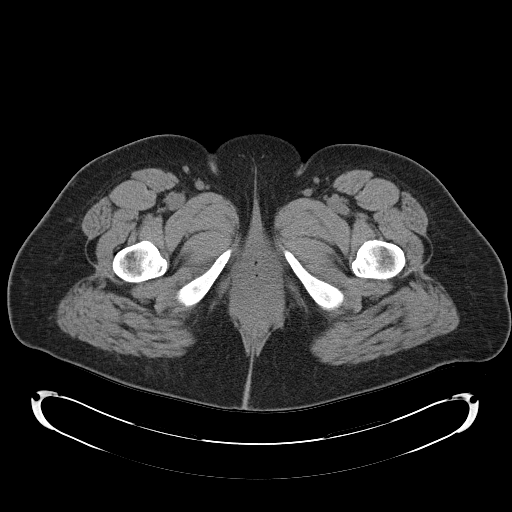
[im 4/93  bone]
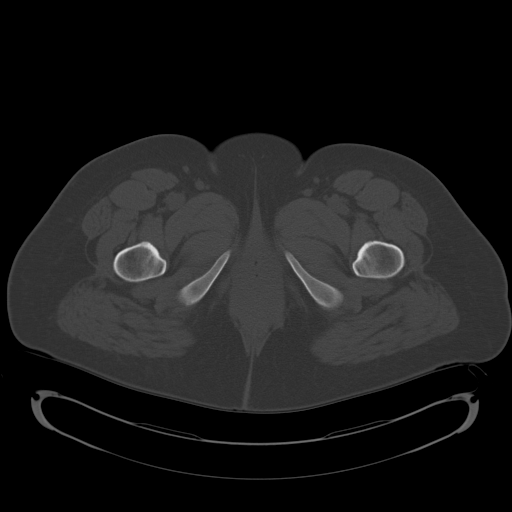
[im 12/93  soft-tissue]
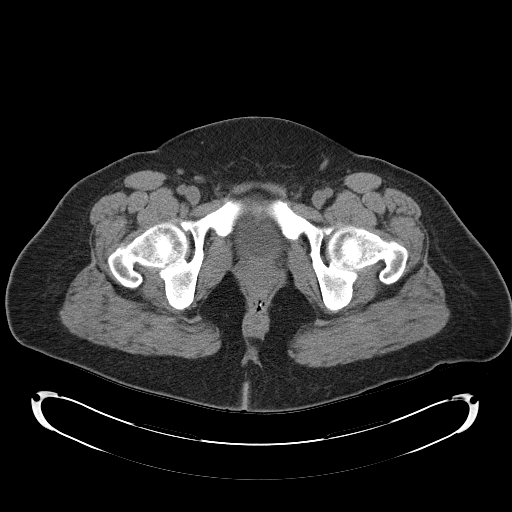
[im 19/93  soft-tissue]
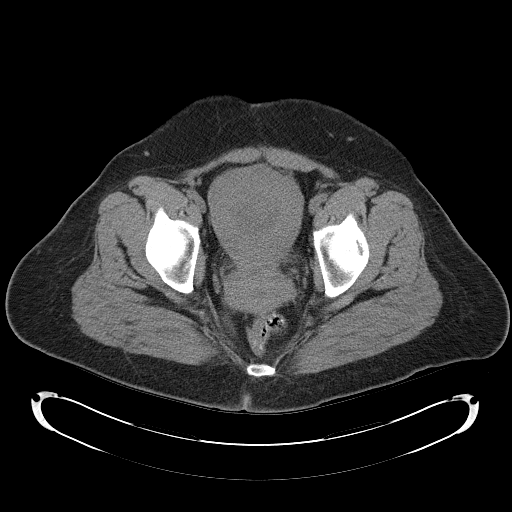
[im 26/93  soft-tissue]
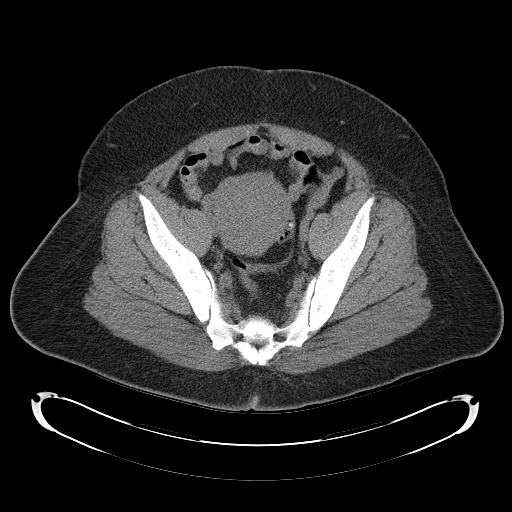
[im 30/93  soft-tissue]
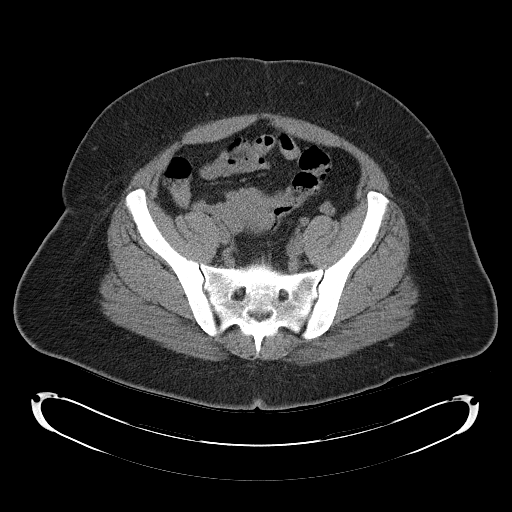
[im 37/93  soft-tissue]
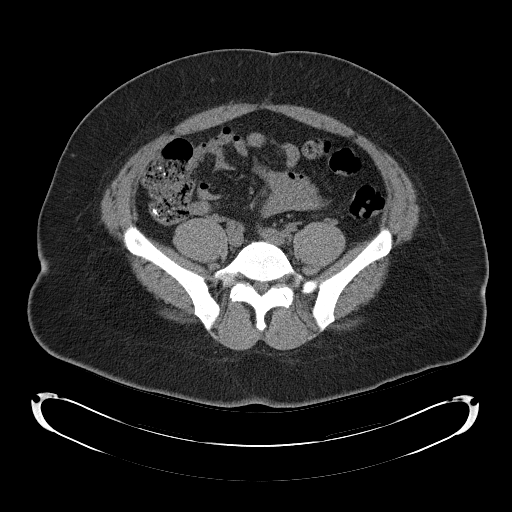
[im 45/93  soft-tissue]
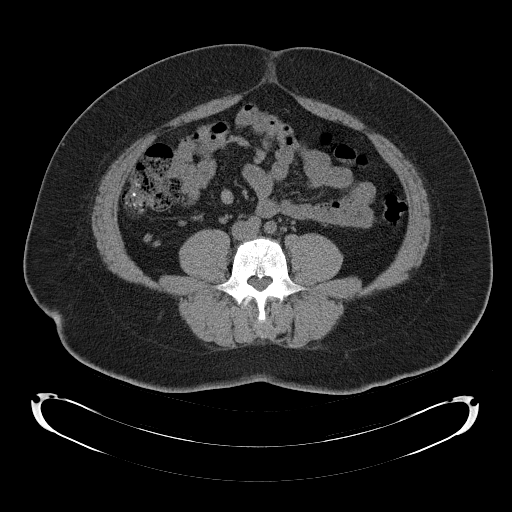
[im 48/93  soft-tissue]
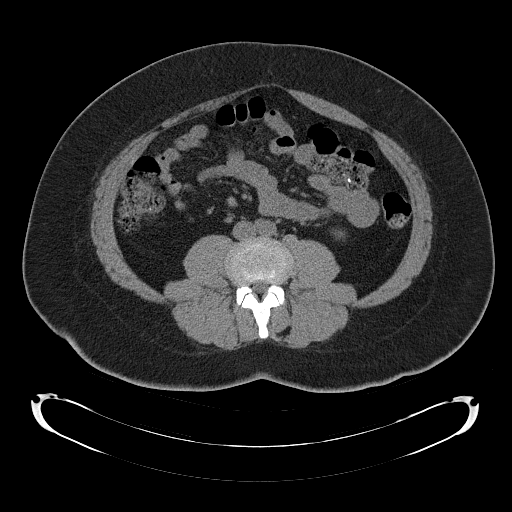
[im 56/93  soft-tissue]
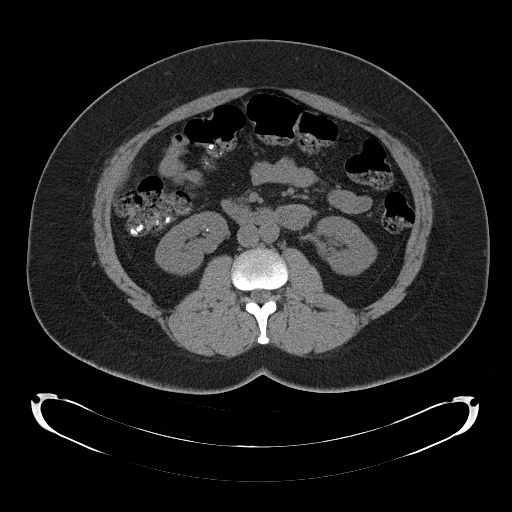
[im 56/93  bone]
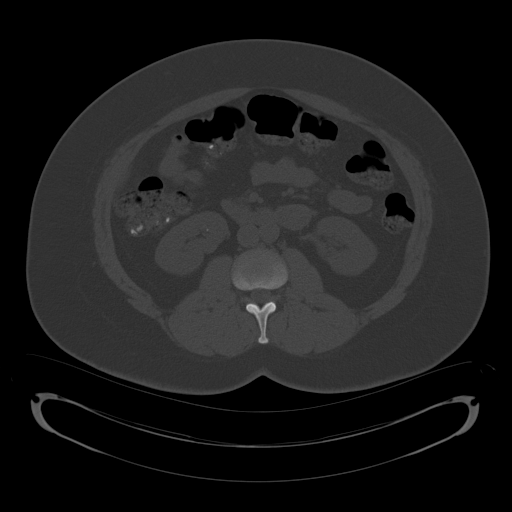
[im 63/93  soft-tissue]
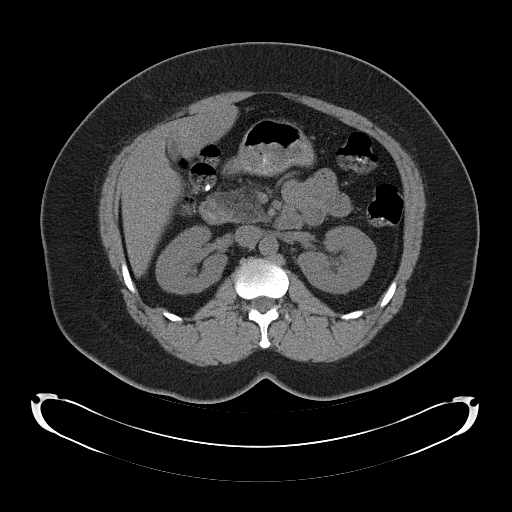
[im 70/93  soft-tissue]
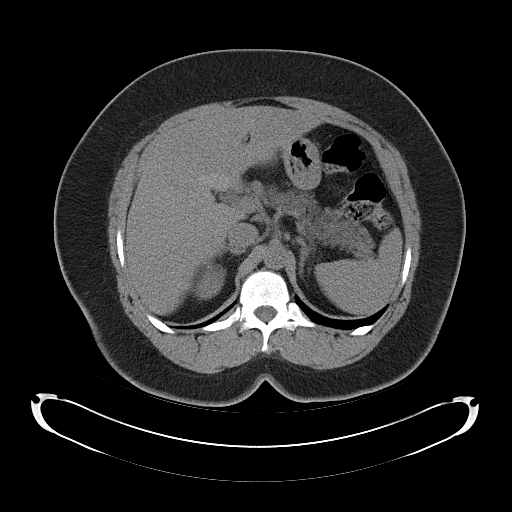
[im 74/93  soft-tissue]
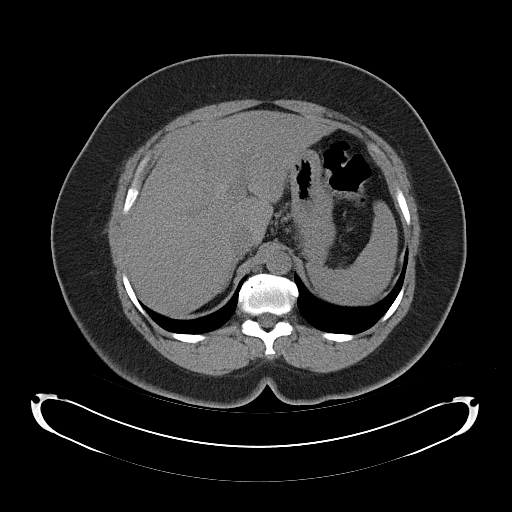
[im 81/93  soft-tissue]
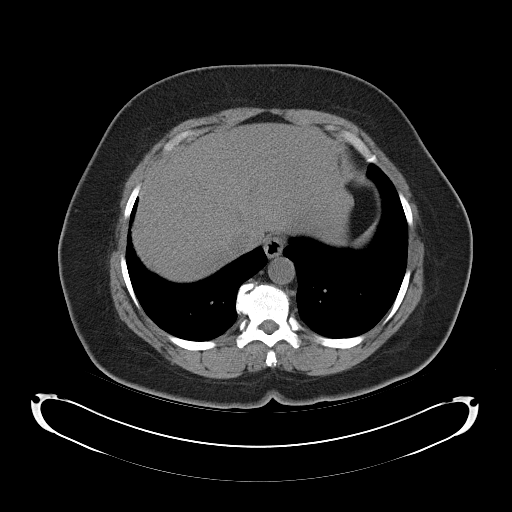
[im 89/93  soft-tissue]
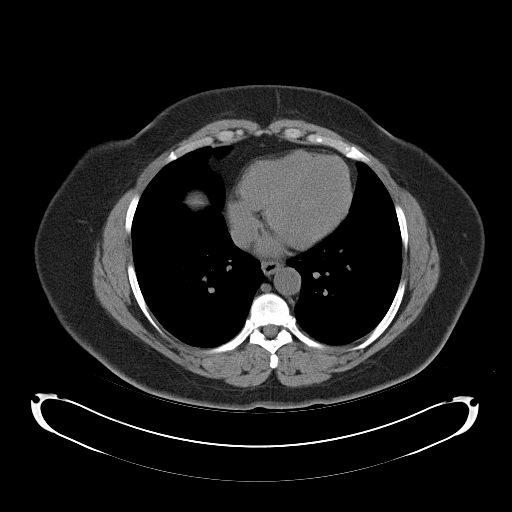

[Series 4: mpr coronal · coronal · 0.86mm/px · 3 of 97 slices shown]
[im 33/97  soft-tissue]
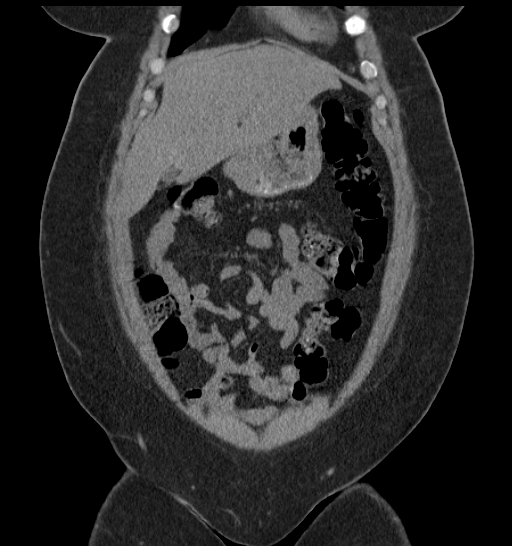
[im 43/97  soft-tissue]
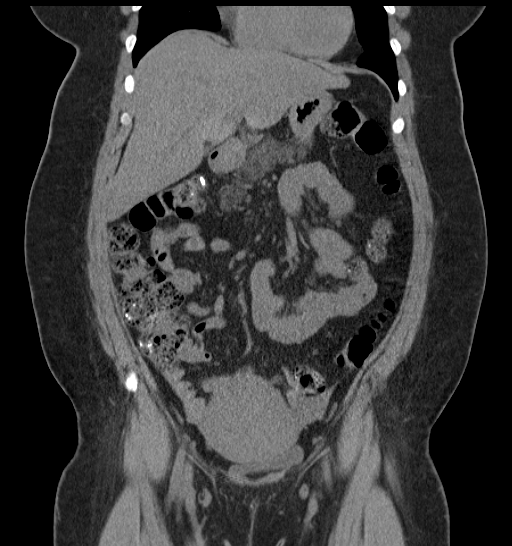
[im 54/97  soft-tissue]
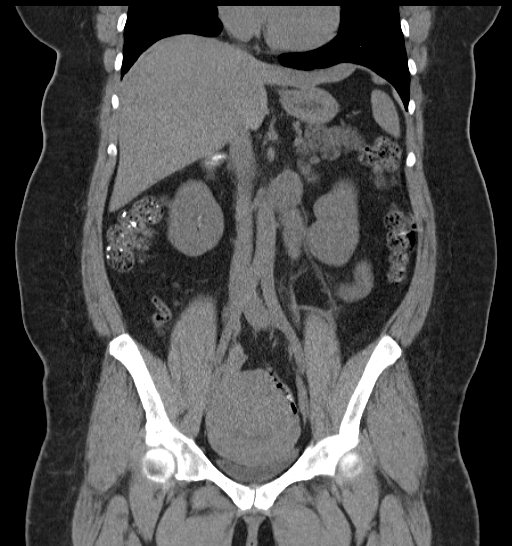

[17 of 46 positions shown; findings below may reference images not displayed]

FINDINGS: The lung bases are clear.  No pleural or pericardial
effusion.

The large stone in the left renal pelvis on the comparison study is
no longer present.  There is no hydronephrosis and no ureteral
stones are identified.  Again seen are bilateral nonobstructing
renal stones. The largest is in the lower pole of the left kidney
and measures 0.5 cm.  No urinary bladder stones are identified.

The liver is low attenuating consistent with fatty infiltration.
The gallbladder is decompressed but otherwise unremarkable.  The
adrenal glands and spleen appear normal.  There is some fatty
atrophy of the pancreas, unchanged.  Fibroid uterus is identified.
The stomach, small and large bowel and appendix appear normal.
There is no lymphadenopathy or fluid.  No focal bony abnormality is
identified.
IMPRESSION: 1.  Negative for hydronephrosis or ureteral stone.  Bilateral
nonobstructing renal stones are identified.  The large stone in the
left renal pelvis on the prior study is no longer present.
2.  Fibroid uterus.
3.  Fatty infiltration of the liver.

## 2015-03-05 ENCOUNTER — Encounter (HOSPITAL_COMMUNITY): Payer: Self-pay

## 2015-03-05 ENCOUNTER — Emergency Department (HOSPITAL_COMMUNITY)
Admission: EM | Admit: 2015-03-05 | Discharge: 2015-03-05 | Disposition: A | Discharge status: Home / Self Care | Payer: Self-pay | Attending: Emergency Medicine | Admitting: Emergency Medicine

## 2015-03-05 DIAGNOSIS — N760 Acute vaginitis: Secondary | ICD-10-CM | POA: Insufficient documentation

## 2015-03-05 DIAGNOSIS — Z7984 Long term (current) use of oral hypoglycemic drugs: Secondary | ICD-10-CM | POA: Insufficient documentation

## 2015-03-05 DIAGNOSIS — Z3202 Encounter for pregnancy test, result negative: Secondary | ICD-10-CM | POA: Insufficient documentation

## 2015-03-05 DIAGNOSIS — E119 Type 2 diabetes mellitus without complications: Secondary | ICD-10-CM | POA: Insufficient documentation

## 2015-03-05 DIAGNOSIS — Z87442 Personal history of urinary calculi: Secondary | ICD-10-CM | POA: Insufficient documentation

## 2015-03-05 DIAGNOSIS — K602 Anal fissure, unspecified: Secondary | ICD-10-CM | POA: Insufficient documentation

## 2015-03-05 DIAGNOSIS — Z87891 Personal history of nicotine dependence: Secondary | ICD-10-CM | POA: Insufficient documentation

## 2015-03-05 DIAGNOSIS — Z79899 Other long term (current) drug therapy: Secondary | ICD-10-CM | POA: Insufficient documentation

## 2015-03-05 LAB — URINALYSIS, ROUTINE W REFLEX MICROSCOPIC
Bilirubin Urine: NEGATIVE
Glucose, UA: 500 mg/dL — AB
Ketones, ur: NEGATIVE mg/dL
Nitrite: NEGATIVE
SPECIFIC GRAVITY, URINE: 1.025 (ref 1.005–1.030)
pH: 6 (ref 5.0–8.0)

## 2015-03-05 LAB — URINE MICROSCOPIC-ADD ON

## 2015-03-05 LAB — POC URINE PREG, ED: PREG TEST UR: NEGATIVE

## 2015-03-05 MED ORDER — CLOTRIMAZOLE 1 % EX CREA: TOPICAL_CREAM | CUTANEOUS | Status: DC

## 2015-03-05 MED ORDER — FLUCONAZOLE 200 MG PO TABS: 200.0000 mg | ORAL_TABLET | Freq: Once | ORAL | Status: AC

## 2015-03-05 NOTE — Discharge Instructions (Signed)
Vaginitis Vaginitis is an inflammation of the vagina. It can happen when the normal bacteria and yeast in the vagina grow too much. There are different types. Treatment will depend on the type you have. HOME CARE  Take all medicines as told by your doctor.  Keep your vagina area clean and dry. Avoid soap. Rinse the area with water.  Avoid washing and cleaning out the vagina (douching).  Do not use tampons or have sex (intercourse) until your treatment is done.  Wipe from front to back after going to the restroom.  Wear cotton underwear.  Avoid wearing underwear while you sleep until your vaginitis is gone.  Avoid tight pants. Avoid underwear or nylons without a cotton panel.  Take off wet clothing (such as a bathing suit) as soon as you can.  Use mild, unscented products. Avoid fabric softeners and scented:  Feminine sprays.  Laundry detergents.  Tampons.  Soaps or bubble baths.  Practice safe sex and use condoms. GET HELP RIGHT AWAY IF:   You have belly (abdominal) pain.  You have a fever or lasting symptoms for more than 2-3 days.  You have a fever and your symptoms suddenly get worse. MAKE SURE YOU:   Understand these instructions.  Will watch this condition.  Will get help right away if you are not doing well or get worse.   This information is not intended to replace advice given to you by your health care provider. Make sure you discuss any questions you have with your health care provider.   Document Released: 04/28/2008 Document Revised: 10/25/2011 Document Reviewed: 07/13/2011 Elsevier Interactive Patient Education 2016 [employer]  

## 2015-03-05 NOTE — ED Notes (Signed)
Pt reports pain from hemorrhoids and c/o vaginal irritation.  Pt says it burns when she urinates.  Denies any vaginal discharge.  Went to [facility] last Friday and was given an antibiotic for UTI.  Pt taking her antibiotics but says is not getting any better.

## 2015-03-08 NOTE — ED Provider Notes (Signed)
CSN: [id number]     Arrival date & time 03/05/15  1714 History   First MD Initiated Contact with Patient 03/05/15 1741     Chief Complaint  Patient presents with  . Hemorrhoids  . Dysuria     (Consider location/radiation/quality/duration/timing/severity/associated sxs/prior Treatment) HPI  Traci Zimmerman is a 50 y.o. female who presents to the Emergency Department complaining of vaginal irritation and burning with urination.  Symptoms began several days ago.  She was seen at another facility and treated with antibiotic for UTI.  She describes a constant burning, itching and feeling "raw" in her vaginal area and pain to her rectal area when she wipes.  She denies vaginal or rectal bleeding, discharge, fever, chills, and pelvic and abdominal pain.    Past Medical History  Diagnosis Date  . Kidney stone   . Diabetes mellitus without complication [facility])    Past Surgical History  Procedure Laterality Date  . Cesarean section    . Tubal ligation    . Lithotripsy     Family History  Problem Relation Age of Onset  . Diabetes Neg Hx    Social History  Substance Use Topics  . Smoking status: Former Games developer  . Smokeless tobacco: None  . Alcohol Use: No     Comment: stopped smoking 25 days ago   OB History    No data available     Review of Systems  Constitutional: Negative for fever, chills and activity change.  Respiratory: Negative for shortness of breath.   Gastrointestinal: Negative for nausea, vomiting and abdominal pain.  Genitourinary: Positive for dysuria, frequency and vaginal pain. Negative for vaginal bleeding, vaginal discharge and difficulty urinating.  Musculoskeletal: Negative for back pain.  Skin: Negative for rash.  Neurological: Negative for dizziness, weakness and numbness.      Allergies  Review of patient's allergies indicates no known allergies.  Home Medications   Prior to Admission medications   Medication Sig Start Date End Date Taking?  Authorizing Provider  benazepril (LOTENSIN) 20 MG tablet Take 20 mg by mouth daily.   Yes Historical Provider, MD  cephALEXin (KEFLEX) 500 MG capsule Take 500 mg by mouth 4 (four) times daily. 10 day course starting on 03/01/2015   Yes Historical Provider, MD  metFORMIN (GLUCOPHAGE) 1000 MG tablet Take 1,000 mg by mouth 2 (two) times daily.   Yes Historical Provider, MD  simvastatin (ZOCOR) 20 MG tablet Take 20 mg by mouth daily.   Yes Historical Provider, MD  clotrimazole (LOTRIMIN) 1 % cream Apply to affected area 2 times daily. 03/05/15   Kharisma Glasner, PA-C  fluconazole (DIFLUCAN) 200 MG tablet Take 1 tablet (200 mg total) by mouth once. 03/05/15 03/12/15  Valmore Arabie, PA-C   BP 157/82 mmHg  Pulse 86  Temp(Src) 97.6 F (36.4 C) (Tympanic)  Resp 18  Ht  (1.727 m)  Wt 101.152 kg  BMI 33.91 kg/m2  SpO2 100%  LMP 02/09/2015 Physical Exam  Constitutional: She appears well-developed and well-nourished. No distress.  HENT:  Head: Atraumatic.  Mouth/Throat: Oropharynx is clear and moist.  Cardiovascular: Normal rate and regular rhythm.   Pulmonary/Chest: Effort normal. No respiratory distress.  Abdominal: Soft. She exhibits no distension. There is no tenderness. There is no rebound and no guarding.  Genitourinary:  Mild diffuse edema of the labia.  No vaginal discharge.  Two  Small anal fissures.  No hemorrhoids or rectal masses  Musculoskeletal: Normal range of motion.  Neurological: She is alert. Coordination normal.  Skin: Skin is warm. No rash noted.  Nursing note and vitals reviewed.   ED Course  Procedures (including critical care time) Labs Review Labs Reviewed  URINALYSIS, ROUTINE W REFLEX MICROSCOPIC (NOT AT [facility]) - Abnormal; Notable for the following:    Glucose, UA 500 (*)    Hgb urine dipstick SMALL (*)    Protein, ur TRACE (*)    Leukocytes, UA TRACE (*)    All other components within normal limits  URINE MICROSCOPIC-ADD ON - Abnormal; Notable for the  following:    Squamous Epithelial / LPF 0-5 (*)    Bacteria, UA RARE (*)    All other components within normal limits  URINE CULTURE  POC URINE PREG, ED    Imaging Review No results found. I have personally reviewed and evaluated these images and lab results as part of my medical decision-making.   EKG Interpretation None      MDM   Final diagnoses:  Vaginitis and vulvovaginitis    Urine culture pending.  Pt well appearing.  Non-toxic.  Currently taking keflex.  No abdominal tenderness.  Will have pt continue the keflex, sx's likely related to vaginitis.  rx for clotrimazole cream and diflucan tab.  Pt stable for d/c.  Agrees to PMD f/u or to return here for worsening sx's    Pauline Aus, Cordelia Poche 03/08/15 2209  Cathren Laine, MD 03/09/15 1630

## 2015-03-09 LAB — URINE CULTURE

## 2015-03-10 ENCOUNTER — Telehealth (HOSPITAL_BASED_OUTPATIENT_CLINIC_OR_DEPARTMENT_OTHER): Payer: Self-pay | Admitting: Emergency Medicine

## 2015-03-10 NOTE — Telephone Encounter (Signed)
Post ED Visit - Positive Culture Follow-up  Culture report reviewed by antimicrobial stewardship pharmacist:   Enzo Bi, Pharm.D.  Celedonio Miyamoto, Pharm.D., BCPS  Garvin Fila, Pharm.D.  Georgina Pillion, Pharm.D., BCPS  [place], [place].D., BCPS, AAHIVP  Estella Husk, Pharm.D., BCPS, AAHIVP  Tennis Must, Pharm.D.  Sherle Poe, [address].D.  Positive urine culture Enterococcus Treated with cephalexin and fluconazole, organism sensitive to the same and no further patient follow-up is required at this time.  Berle Mull 03/10/2015, 9:42 AM

## 2015-03-10 NOTE — Progress Notes (Signed)
ED Antimicrobial Stewardship Positive Culture Follow Up   Traci Zimmerman is an 50 y.o. female who presented to [facility] on 03/05/2015 with a chief complaint of  Chief Complaint  Patient presents with  . Hemorrhoids  . Dysuria    Recent Results (from the past 720 hour(s))  Urine culture     Status: None   Collection Time: 03/05/15  6:51 PM  Result Value Ref Range Status   Specimen Description URINE, RANDOM  Final   Special Requests NONE  Final   Culture   Final    60,000 COLONIES/ml ENTEROCOCCUS SPECIES >=100,000 COLONIES/mL LACTOBACILLUS SPECIES Standardized susceptibility testing for this organism is not available. Performed at [facility]    Report Status 03/09/2015 FINAL  Final   Organism ID, Bacteria ENTEROCOCCUS SPECIES  Final      Susceptibility   Enterococcus species - MIC*    AMPICILLIN <=2 SENSITIVE Sensitive     LEVOFLOXACIN 1 SENSITIVE Sensitive     NITROFURANTOIN <=16 SENSITIVE Sensitive     VANCOMYCIN 1 SENSITIVE Sensitive     * 60,000 COLONIES/ml ENTEROCOCCUS SPECIES    Patient trichomonas positive. No concerns for UTI, more likely vaginitis. No treatment indicated at this time for positive urinary culture.  ED Provider: Burna Forts, PA-C  Sherron Monday, PharmD Clinical Pharmacy Resident Pager: [phone] 03/10/2015 9:12 AM

## 2015-07-11 ENCOUNTER — Encounter (HOSPITAL_COMMUNITY): Payer: Self-pay

## 2015-07-11 ENCOUNTER — Emergency Department (HOSPITAL_COMMUNITY)
Admission: EM | Admit: 2015-07-11 | Discharge: 2015-07-11 | Disposition: A | Discharge status: Home / Self Care | Payer: Worker's Compensation | Attending: Emergency Medicine | Admitting: Emergency Medicine

## 2015-07-11 DIAGNOSIS — R748 Abnormal levels of other serum enzymes: Secondary | ICD-10-CM | POA: Diagnosis not present

## 2015-07-11 DIAGNOSIS — R200 Anesthesia of skin: Secondary | ICD-10-CM | POA: Diagnosis not present

## 2015-07-11 DIAGNOSIS — M79602 Pain in left arm: Secondary | ICD-10-CM | POA: Insufficient documentation

## 2015-07-11 DIAGNOSIS — Z7984 Long term (current) use of oral hypoglycemic drugs: Secondary | ICD-10-CM | POA: Insufficient documentation

## 2015-07-11 DIAGNOSIS — D509 Iron deficiency anemia, unspecified: Secondary | ICD-10-CM | POA: Diagnosis not present

## 2015-07-11 DIAGNOSIS — E119 Type 2 diabetes mellitus without complications: Secondary | ICD-10-CM | POA: Diagnosis not present

## 2015-07-11 DIAGNOSIS — M79601 Pain in right arm: Secondary | ICD-10-CM

## 2015-07-11 DIAGNOSIS — Z87891 Personal history of nicotine dependence: Secondary | ICD-10-CM | POA: Insufficient documentation

## 2015-07-11 LAB — CBC WITH DIFFERENTIAL/PLATELET
BASOS ABS: 0 10*3/uL (ref 0.0–0.1)
BASOS PCT: 0 %
EOS ABS: 0.1 10*3/uL (ref 0.0–0.7)
Eosinophils Relative: 1 %
HCT: 29.1 % — ABNORMAL LOW (ref 36.0–46.0)
HEMOGLOBIN: 9 g/dL — AB (ref 12.0–15.0)
LYMPHS ABS: 1.1 10*3/uL (ref 0.7–4.0)
Lymphocytes Relative: 11 %
MCH: 23.9 pg — ABNORMAL LOW (ref 26.0–34.0)
MCHC: 30.9 g/dL (ref 30.0–36.0)
MCV: 77.2 fL — ABNORMAL LOW (ref 78.0–100.0)
Monocytes Absolute: 0.3 10*3/uL (ref 0.1–1.0)
Monocytes Relative: 3 %
NEUTROS PCT: 85 %
Neutro Abs: 8.2 10*3/uL — ABNORMAL HIGH (ref 1.7–7.7)
PLATELETS: 333 10*3/uL (ref 150–400)
RBC: 3.77 MIL/uL — AB (ref 3.87–5.11)
RDW: 15.5 % (ref 11.5–15.5)
WBC: 9.7 10*3/uL (ref 4.0–10.5)

## 2015-07-11 LAB — COMPREHENSIVE METABOLIC PANEL
ALK PHOS: 210 U/L — AB (ref 38–126)
ALT: 36 U/L (ref 14–54)
AST: 37 U/L (ref 15–41)
Albumin: 3.9 g/dL (ref 3.5–5.0)
Anion gap: 9 (ref 5–15)
BILIRUBIN TOTAL: 0.4 mg/dL (ref 0.3–1.2)
BUN: 10 mg/dL (ref 6–20)
CALCIUM: 10.5 mg/dL — AB (ref 8.9–10.3)
CHLORIDE: 108 mmol/L (ref 101–111)
CO2: 20 mmol/L — ABNORMAL LOW (ref 22–32)
CREATININE: 0.69 mg/dL (ref 0.44–1.00)
GFR calc non Af Amer: >60 mL/min (ref >60)
Glucose, Bld: 229 mg/dL — ABNORMAL HIGH (ref 65–99)
Potassium: 3.5 mmol/L (ref 3.5–5.1)
Sodium: 137 mmol/L (ref 135–145)
TOTAL PROTEIN: 7.1 g/dL (ref 6.5–8.1)

## 2015-07-11 LAB — SEDIMENTATION RATE: SED RATE: 15 mm/h (ref 0–22)

## 2015-07-11 LAB — CK: CK TOTAL: 161 U/L (ref 38–234)

## 2015-07-11 MED ORDER — OXYCODONE-ACETAMINOPHEN 5-325 MG PO TABS: 1.0000 | ORAL_TABLET | ORAL | Status: DC | PRN

## 2015-07-11 MED ORDER — CYCLOBENZAPRINE HCL 10 MG PO TABS: 10.0000 mg | ORAL_TABLET | Freq: Three times a day (TID) | ORAL | Status: DC | PRN

## 2015-07-11 MED ORDER — MELOXICAM 15 MG PO TABS: 15.0000 mg | ORAL_TABLET | Freq: Every day | ORAL | Status: DC

## 2015-07-11 NOTE — ED Provider Notes (Signed)
CSN: [id number]     Arrival date & time 07/11/15  0003 History  By signing my name below, I, Linna Darner, attest that this documentation has been prepared under the direction and in the presence of physician practitioner, Dione Booze, MD. Electronically Signed: Linna Darner, Scribe. 07/11/2015. 12:59 AM.   Chief Complaint  Patient presents with  . Arm Pain    The history is provided by the patient. No language interpreter was used.     HPI Comments: Traci Zimmerman is a 50 y.o. female with h/o HLD and DM who presents to the Emergency Department complaining of sudden onset, constant, aching, 10/10 bilateral forearm pain and tingling for the last three days. Pt reports that the pain radiates throughout her arms and endorses tingling in her bilateral fingers. She endorses significant pain exacerbation with bilateral arm movement so she has not been moving them. She endorses mild left shoulder pain. 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 She reports that she took ibuprofen with significant relief on the first day of onset but it has not been providing relief since. Pt denies neuro deficits or any other associated symptoms. Her PCP is Dr. Olena Leatherwood.  Past Medical History  Diagnosis Date  . Kidney stone   . Diabetes mellitus without complication [facility])    Past Surgical History  Procedure Laterality Date  . Cesarean section    . Tubal ligation    . Lithotripsy     Family History  Problem Relation Age of Onset  . Diabetes Neg Hx    Social History  Substance Use Topics  . Smoking status: Former Games developer  . Smokeless tobacco: None  . Alcohol Use: No     Comment: stopped smoking 25 days ago   OB History    No data available     Review of Systems  Musculoskeletal: Positive for arthralgias.  Neurological: Positive for numbness.       Negative for  sensation loss.  All other systems reviewed and are negative.   Allergies  Review of patient's allergies indicates no known allergies.  Home Medications   Prior to Admission medications   Medication Sig Start Date End Date Taking? Authorizing Provider  benazepril (LOTENSIN) 20 MG tablet Take 20 mg by mouth daily.    Historical Provider, MD  cephALEXin (KEFLEX) 500 MG capsule Take 500 mg by mouth 4 (four) times daily. 10 day course starting on 03/01/2015    Historical Provider, MD  clotrimazole (LOTRIMIN) 1 % cream Apply to affected area 2 times daily. 03/05/15   Tammy Triplett, PA-C  metFORMIN (GLUCOPHAGE) 1000 MG tablet Take 1,000 mg by mouth 2 (two) times daily.    Historical Provider, MD  simvastatin (ZOCOR) 20 MG tablet Take 20 mg by mouth daily.    Historical Provider, MD   LMP 06/28/2015 (Approximate) Physical Exam  Constitutional: She is oriented to person, place, and time. She appears well-developed and well-nourished. No distress.  HENT:  Head: Normocephalic and atraumatic.  Eyes: Conjunctivae and EOM are normal. Pupils are equal, round, and reactive to light.  Neck: Normal range of motion. Neck supple. No JVD present.  Cardiovascular: Normal rate, regular rhythm and normal heart sounds.   No murmur heard. Pulmonary/Chest: Effort normal and breath sounds normal. She has no wheezes. She has no rales. She exhibits no tenderness.  Abdominal: Soft. Bowel  sounds are normal. She exhibits no distension and no mass. There is no tenderness.  Musculoskeletal: Normal range of motion. She exhibits no edema.  Mild tenderness diffusely from proximal shoulder through the upper arm and forearm bilaterally, no point tenderness, full passive ROM present.  Lymphadenopathy:    She has no cervical adenopathy.  Neurological: She is alert and oriented to person, place, and time. No cranial nerve deficit. She exhibits normal muscle tone. Coordination normal.  Skin: Skin is warm and dry. No rash  noted.  Psychiatric: She has a normal mood and affect. Her behavior is normal. Judgment and thought content normal.  Nursing note and vitals reviewed.   ED Course  Procedures (including critical care time)  DIAGNOSTIC STUDIES: Oxygen Saturation is 100% on room air, normal by my interpretation.    COORDINATION OF CARE: 12:59 AM Discussed treatment plan with pt at bedside and pt agreed to plan.  Labs Review Results for orders placed or performed during the hospital encounter of 07/11/15  Comprehensive metabolic panel  Result Value Ref Range   Sodium 137 135 - 145 mmol/L   Potassium 3.5 3.5 - 5.1 mmol/L   Chloride 108 101 - 111 mmol/L   CO2 20 (L) 22 - 32 mmol/L   Glucose, Bld 229 (H) 65 - 99 mg/dL   BUN 10 6 - 20 mg/dL   Creatinine, Ser 6.21 0.44 - 1.00 mg/dL   Calcium 30.8 (H) 8.9 - 10.3 mg/dL   Total Protein 7.1 6.5 - 8.1 g/dL   Albumin 3.9 3.5 - 5.0 g/dL   AST 37 15 - 41 U/L   ALT 36 14 - 54 U/L   Alkaline Phosphatase 210 (H) 38 - 126 U/L   Total Bilirubin 0.4 0.3 - 1.2 mg/dL   GFR calc non Af Amer >60 >60 mL/min   GFR calc Af Amer >60 >60 mL/min   Anion gap 9 5 - 15  CBC with Differential  Result Value Ref Range   WBC 9.7 4.0 - 10.5 K/uL   RBC 3.77 (L) 3.87 - 5.11 MIL/uL   Hemoglobin 9.0 (L) 12.0 - 15.0 g/dL   HCT 65.7 (L) 84.6 - 96.2 %   MCV 77.2 (L) 78.0 - 100.0 fL   MCH 23.9 (L) 26.0 - 34.0 pg   MCHC 30.9 30.0 - 36.0 g/dL   RDW 95.2 84.1 - 32.4 %   Platelets 333 150 - 400 K/uL   Neutrophils Relative % 85 %   Neutro Abs 8.2 (H) 1.7 - 7.7 K/uL   Lymphocytes Relative 11 %   Lymphs Abs 1.1 0.7 - 4.0 K/uL   Monocytes Relative 3 %   Monocytes Absolute 0.3 0.1 - 1.0 K/uL   Eosinophils Relative 1 %   Eosinophils Absolute 0.1 0.0 - 0.7 K/uL   Basophils Relative 0 %   Basophils Absolute 0.0 0.0 - 0.1 K/uL  Sedimentation rate  Result Value Ref Range   Sed Rate 15 0 - 22 mm/hr  CK  Result Value Ref Range   Total CK 161 38 - 234 U/L   I have personally reviewed  and evaluated these images and lab results as part of my medical decision-making.   MDM   Final diagnoses:  Bilateral arm pain  Hypercalcemia  Elevated alkaline phosphatase level  Microcytic anemia    Bilateral arm pain of uncertain cause. It is noted that she is on a statin, so CK will be checked to rule out rhabdomyolysis. Sedimentation rate will be checked to evaluate  for possible polymyalgia rheumatica. Old records were reviewed and she has no relevant past visits. Laboratory workup showed normal CK and sedimentation rate. However, significant findings were of hemoglobin of 9.0, calcium 10.5, and alkaline phosphatase of 210. Alkaline phosphatase and calcium have been elevated in the past raising possibility of hyperparathyroidism. Anemia is slightly worse than it had been in the past but it has been several years since her last hemoglobin. She is referred back to her primary care physician for evaluation of these items. She is at discharged with prescriptions for meloxicam, oxycodone-acetaminophen, and cyclobenzaprine.  I personally performed the services described in this documentation, which was scribed in my presence. The recorded information has been reviewed and is accurate.     Dione Boozeavid Odean Mcelwain, MD 07/11/15 [phone]

## 2015-07-11 NOTE — Discharge Instructions (Signed)
Your evaluation today did not show the reason for your pain. However, you were found to be anemic, and with elevation of your blood calcium and alkaline phosphatase - these need to be investigated by your primary care doctor.  Meloxicam tablets What is this medicine? MELOXICAM (mel OX i cam) is a non-steroidal anti-inflammatory drug (NSAID). It is used to reduce swelling and to treat pain. It may be used for osteoarthritis, rheumatoid arthritis, or juvenile rheumatoid arthritis. This medicine may be used for other purposes; ask your health care provider or pharmacist if you have questions. What should I tell my health care provider before I take this medicine? They need to know if you have any of these conditions: -bleeding disorders -cigarette smoker -coronary artery bypass graft (CABG) surgery within the past 2 weeks -drink more than 3 alcohol-containing drinks per day -heart disease -high blood pressure -history of stomach bleeding -kidney disease -liver disease -lung or breathing disease, like asthma -stomach or intestine problems -an unusual or allergic reaction to meloxicam, aspirin, other NSAIDs, other medicines, foods, dyes, or preservatives -pregnant or trying to get pregnant -breast-feeding How should I use this medicine? Take this medicine by mouth with a full glass of water. Follow the directions on the prescription label. You can take it with or without food. If it upsets your stomach, take it with food. Take your medicine at regular intervals. Do not take it more often than directed. Do not stop taking except on your doctor's advice. A special MedGuide will be given to you by the pharmacist with each prescription and refill. Be sure to read this information carefully each time. Talk to your pediatrician regarding the use of this medicine in children. While this drug may be prescribed for selected conditions, precautions do apply. Patients over 44 years old may have a stronger  reaction and need a smaller dose. Overdosage: If you think you have taken too much of this medicine contact a poison control center or emergency room at once. NOTE: This medicine is only for you. Do not share this medicine with others. What if I miss a dose? If you miss a dose, take it as soon as you can. If it is almost time for your next dose, take only that dose. Do not take double or extra doses. What may interact with this medicine? Do not take this medicine with any of the following medications: -cidofovir -ketorolac This medicine may also interact with the following medications: -aspirin and aspirin-like medicines -certain medicines for blood pressure, heart disease, irregular heart beat -certain medicines for depression, anxiety, or psychotic disturbances -certain medicines that treat or prevent blood clots like warfarin, enoxaparin, dalteparin, apixaban, dabigatran, rivaroxaban -cyclosporine -digoxin -diuretics -methotrexate -other NSAIDs, medicines for pain and inflammation, like ibuprofen and naproxen -pemetrexed This list may not describe all possible interactions. Give your health care provider a list of all the medicines, herbs, non-prescription drugs, or dietary supplements you use. Also tell them if you smoke, drink alcohol, or use illegal drugs. Some items may interact with your medicine. What should I watch for while using this medicine? Tell your doctor or healthcare professional if your symptoms do not start to get better or if they get worse. Do not take other medicines that contain aspirin, ibuprofen, or naproxen with this medicine. Side effects such as stomach upset, nausea, or ulcers may be more likely to occur. Many medicines available without a prescription should not be taken with this medicine. This medicine can cause ulcers and bleeding in  the stomach and intestines at any time during treatment. This can happen with no warning and may cause death. There is  increased risk with taking this medicine for a long time. Smoking, drinking alcohol, older age, and poor health can also increase risks. Call your doctor right away if you have stomach pain or blood in your vomit or stool. This medicine does not prevent heart attack or stroke. In fact, this medicine may increase the chance of a heart attack or stroke. The chance may increase with longer use of this medicine and in people who have heart disease. If you take aspirin to prevent heart attack or stroke, talk with your doctor or health care professional. What side effects may I notice from receiving this medicine? Side effects that you should report to your doctor or health care professional as soon as possible: -allergic reactions like skin rash, itching or hives, swelling of the face, lips, or tongue -nausea, vomiting -signs and symptoms of a blood clot such as breathing problems; changes in vision; chest pain; severe, sudden headache; pain, swelling, warmth in the leg; trouble speaking; sudden numbness or weakness of the face, arm, or leg -signs and symptoms of bleeding such as bloody or black, tarry stools; red or dark-brown urine; spitting up blood or brown material that looks like coffee grounds; red spots on the skin; unusual bruising or bleeding from the eye, gums, or nose -signs and symptoms of liver injury like dark yellow or brown urine; general ill feeling or flu-like symptoms; light-colored stools; loss of appetite; nausea; right upper belly pain; unusually weak or tired; yellowing of the eyes or skin -signs and symptoms of stroke like changes in vision; confusion; trouble speaking or understanding; severe headaches; sudden numbness or weakness of the face, arm, or leg; trouble walking; dizziness; loss of balance or coordination Side effects that usually do not require medical attention (report these to your doctor or health care professional if they continue or are  bothersome): -constipation -diarrhea -gas This list may not describe all possible side effects. Call your doctor for medical advice about side effects. You may report side effects to FDA at 1-800-FDA-1088. Where should I keep my medicine? Keep out of the reach of children. Store at room temperature between 15 and 30 degrees C (59 and 86 degrees F). Throw away any unused medicine after the expiration date. NOTE: This sheet is a summary. It may not cover all possible information. If you have questions about this medicine, talk to your doctor, pharmacist, or health care provider.    2016, [employer]. (2014-08-20 13:02:23)  Cyclobenzaprine tablets What is this medicine? CYCLOBENZAPRINE (sye kloe BEN za preen) is a muscle relaxer. It is used to treat muscle pain, spasms, and stiffness. This medicine may be used for other purposes; ask your health care provider or pharmacist if you have questions. What should I tell my health care provider before I take this medicine? They need to know if you have any of these conditions: -heart disease, irregular heartbeat, or previous heart attack -liver disease -thyroid problem -an unusual or allergic reaction to cyclobenzaprine, tricyclic antidepressants, lactose, other medicines, foods, dyes, or preservatives -pregnant or trying to get pregnant -breast-feeding How should I use this medicine? Take this medicine by mouth with a glass of water. Follow the directions on the prescription label. If this medicine upsets your stomach, take it with food or milk. Take your medicine at regular intervals. Do not take it more often than directed. Talk to your  pediatrician regarding the use of this medicine in children. Special care may be needed. Overdosage: If you think you have taken too much of this medicine contact a poison control center or emergency room at once. NOTE: This medicine is only for you. Do not share this medicine with others. What if I  miss a dose? If you miss a dose, take it as soon as you can. If it is almost time for your next dose, take only that dose. Do not take double or extra doses. What may interact with this medicine? Do not take this medicine with any of the following medications: -certain medicines for fungal infections like fluconazole, itraconazole, ketoconazole, posaconazole, voriconazole -cisapride -dofetilide -dronedarone -droperidol -flecainide -grepafloxacin -halofantrine -levomethadyl -MAOIs like Carbex, Eldepryl, Marplan, Nardil, and Parnate -nilotinib -pimozide -probucol -sertindole -thioridazine -ziprasidone This medicine may also interact with the following medications: -abarelix -alcohol -certain medicines for cancer -certain medicines for depression, anxiety, or psychotic disturbances -certain medicines for infection like alfuzosin, chloroquine, clarithromycin, levofloxacin, mefloquine, pentamidine, troleandomycin -certain medicines for an irregular heart beat -certain medicines used for sleep or numbness during surgery or procedure -contrast dyes -dolasetron -guanethidine -methadone -octreotide -ondansetron -other medicines that prolong the QT interval (cause an abnormal heart rhythm) -palonosetron -phenothiazines like chlorpromazine, mesoridazine, prochlorperazine, thioridazine -tramadol -vardenafil This list may not describe all possible interactions. Give your health care provider a list of all the medicines, herbs, non-prescription drugs, or dietary supplements you use. Also tell them if you smoke, drink alcohol, or use illegal drugs. Some items may interact with your medicine. What should I watch for while using this medicine? Check with your doctor or health care professional if your condition does not improve within 1 to 3 weeks. You may get drowsy or dizzy when you first start taking the medicine or change doses. Do not drive, use machinery, or do anything that may be  dangerous until you know how the medicine affects you. Stand or sit up slowly. Your mouth may get dry. Drinking water, chewing sugarless gum, or sucking on hard candy may help. What side effects may I notice from receiving this medicine? Side effects that you should report to your doctor or health care professional as soon as possible: -allergic reactions like skin rash, itching or hives, swelling of the face, lips, or tongue -chest pain -fast heartbeat -hallucinations -seizures -vomiting Side effects that usually do not require medical attention (report to your doctor or health care professional if they continue or are bothersome): -headache This list may not describe all possible side effects. Call your doctor for medical advice about side effects. You may report side effects to FDA at 1-800-FDA-1088. Where should I keep my medicine? Keep out of the reach of children. Store at room temperature between 15 and 30 degrees C (59 and 86 degrees F). Keep container tightly closed. Throw away any unused medicine after the expiration date. NOTE: This sheet is a summary. It may not cover all possible information. If you have questions about this medicine, talk to your doctor, pharmacist, or health care provider.    2016, [employer]. (2012-08-27 12:48:19)  Acetaminophen; Oxycodone tablets What is this medicine? ACETAMINOPHEN; OXYCODONE (a set a MEE noe fen; ox i KOE done) is a pain reliever. It is used to treat moderate to severe pain. This medicine may be used for other purposes; ask your health care provider or pharmacist if you have questions. What should I tell my health care provider before I take this medicine? They need to know  if you have any of these conditions: -brain tumor -Crohn's disease, inflammatory bowel disease, or ulcerative colitis -drug abuse or addiction -head injury -heart or circulation problems -if you often drink alcohol -kidney disease or problems going  to the bathroom -liver disease -lung disease, asthma, or breathing problems -an unusual or allergic reaction to acetaminophen, oxycodone, other opioid analgesics, other medicines, foods, dyes, or preservatives -pregnant or trying to get pregnant -breast-feeding How should I use this medicine? Take this medicine by mouth with a full glass of water. Follow the directions on the prescription label. You can take it with or without food. If it upsets your stomach, take it with food. Take your medicine at regular intervals. Do not take it more often than directed. Talk to your pediatrician regarding the use of this medicine in children. Special care may be needed. Patients over 83 years old may have a stronger reaction and need a smaller dose. Overdosage: If you think you have taken too much of this medicine contact a poison control center or emergency room at once. NOTE: This medicine is only for you. Do not share this medicine with others. What if I miss a dose? If you miss a dose, take it as soon as you can. If it is almost time for your next dose, take only that dose. Do not take double or extra doses. What may interact with this medicine? -alcohol -antihistamines -barbiturates like amobarbital, butalbital, butabarbital, methohexital, pentobarbital, phenobarbital, thiopental, and secobarbital -benztropine -drugs for bladder problems like solifenacin, trospium, oxybutynin, tolterodine, hyoscyamine, and methscopolamine -drugs for breathing problems like ipratropium and tiotropium -drugs for certain stomach or intestine problems like propantheline, homatropine methylbromide, glycopyrrolate, atropine, belladonna, and dicyclomine -general anesthetics like etomidate, ketamine, nitrous oxide, propofol, desflurane, enflurane, halothane, isoflurane, and sevoflurane -medicines for depression, anxiety, or psychotic disturbances -medicines for sleep -muscle relaxants -naltrexone -narcotic medicines  (opiates) for pain -phenothiazines like perphenazine, thioridazine, chlorpromazine, mesoridazine, fluphenazine, prochlorperazine, promazine, and trifluoperazine -scopolamine -tramadol -trihexyphenidyl This list may not describe all possible interactions. Give your health care provider a list of all the medicines, herbs, non-prescription drugs, or dietary supplements you use. Also tell them if you smoke, drink alcohol, or use illegal drugs. Some items may interact with your medicine. What should I watch for while using this medicine? Tell your doctor or health care professional if your pain does not go away, if it gets worse, or if you have new or a different type of pain. You may develop tolerance to the medicine. Tolerance means that you will need a higher dose of the medication for pain relief. Tolerance is normal and is expected if you take this medicine for a long time. Do not suddenly stop taking your medicine because you may develop a severe reaction. Your body becomes used to the medicine. This does NOT mean you are addicted. Addiction is a behavior related to getting and using a drug for a non-medical reason. If you have pain, you have a medical reason to take pain medicine. Your doctor will tell you how much medicine to take. If your doctor wants you to stop the medicine, the dose will be slowly lowered over time to avoid any side effects. You may get drowsy or dizzy. Do not drive, use machinery, or do anything that needs mental alertness until you know how this medicine affects you. Do not stand or sit up quickly, especially if you are an older patient. This reduces the risk of dizzy or fainting spells. Alcohol may interfere with the  effect of this medicine. Avoid alcoholic drinks. There are different types of narcotic medicines (opiates) for pain. If you take more than one type at the same time, you may have more side effects. Give your health care provider a list of all medicines you use. Your  doctor will tell you how much medicine to take. Do not take more medicine than directed. Call emergency for help if you have problems breathing. The medicine will cause constipation. Try to have a bowel movement at least every 2 to 3 days. If you do not have a bowel movement for 3 days, call your doctor or health care professional. Do not take Tylenol (acetaminophen) or medicines that have acetaminophen with this medicine. Too much acetaminophen can be very dangerous. Many nonprescription medicines contain acetaminophen. Always read the labels carefully to avoid taking more acetaminophen. What side effects may I notice from receiving this medicine? Side effects that you should report to your doctor or health care professional as soon as possible: -allergic reactions like skin rash, itching or hives, swelling of the face, lips, or tongue -breathing difficulties, wheezing -confusion -light headedness or fainting spells -severe stomach pain -unusually weak or tired -yellowing of the skin or the whites of the eyes Side effects that usually do not require medical attention (report to your doctor or health care professional if they continue or are bothersome): -dizziness -drowsiness -nausea -vomiting This list may not describe all possible side effects. Call your doctor for medical advice about side effects. You may report side effects to FDA at 1-800-FDA-1088. Where should I keep my medicine? Keep out of the reach of children. This medicine can be abused. Keep your medicine in a safe place to protect it from theft. Do not share this medicine with anyone. Selling or giving away this medicine is dangerous and against the law. This medicine may cause accidental overdose and death if it taken by other adults, children, or pets. Mix any unused medicine with a substance like cat litter or coffee grounds. Then throw the medicine away in a sealed container like a sealed bag or a coffee can with a lid. Do not  use the medicine after the expiration date. Store at room temperature between 20 and 25 degrees C (68 and 77 degrees F). NOTE: This sheet is a summary. It may not cover all possible information. If you have questions about this medicine, talk to your doctor, pharmacist, or health care provider.    2016, [employer]. (2013-12-31 15:18:46)

## 2015-07-11 NOTE — ED Notes (Signed)
Bi-lateral arm pain.  Ibuprofen eases the pain, but today it hurts bad.  No chest pain.

## 2015-07-23 MED FILL — Oxycodone w/ Acetaminophen Tab 5-325 MG: ORAL | Qty: 6 | Status: AC

## 2017-02-16 DIAGNOSIS — N920 Excessive and frequent menstruation with regular cycle: Secondary | ICD-10-CM | POA: Diagnosis not present

## 2017-02-16 DIAGNOSIS — Z01411 Encounter for gynecological examination (general) (routine) with abnormal findings: Secondary | ICD-10-CM | POA: Diagnosis not present

## 2017-02-16 DIAGNOSIS — D259 Leiomyoma of uterus, unspecified: Secondary | ICD-10-CM | POA: Diagnosis not present

## 2017-03-09 DIAGNOSIS — Z1231 Encounter for screening mammogram for malignant neoplasm of breast: Secondary | ICD-10-CM | POA: Diagnosis not present

## 2017-03-26 DIAGNOSIS — D251 Intramural leiomyoma of uterus: Secondary | ICD-10-CM | POA: Diagnosis not present

## 2017-03-26 DIAGNOSIS — N924 Excessive bleeding in the premenopausal period: Secondary | ICD-10-CM | POA: Diagnosis not present

## 2017-03-27 DIAGNOSIS — N760 Acute vaginitis: Secondary | ICD-10-CM | POA: Diagnosis not present

## 2017-03-27 DIAGNOSIS — N72 Inflammatory disease of cervix uteri: Secondary | ICD-10-CM | POA: Diagnosis not present

## 2017-03-27 DIAGNOSIS — T192XXA Foreign body in vulva and vagina, initial encounter: Secondary | ICD-10-CM | POA: Diagnosis not present

## 2017-04-03 DIAGNOSIS — N72 Inflammatory disease of cervix uteri: Secondary | ICD-10-CM | POA: Diagnosis not present

## 2017-04-03 DIAGNOSIS — N920 Excessive and frequent menstruation with regular cycle: Secondary | ICD-10-CM | POA: Diagnosis not present

## 2017-04-03 DIAGNOSIS — N938 Other specified abnormal uterine and vaginal bleeding: Secondary | ICD-10-CM | POA: Diagnosis not present

## 2017-05-03 DIAGNOSIS — T8189XA Other complications of procedures, not elsewhere classified, initial encounter: Secondary | ICD-10-CM | POA: Diagnosis not present

## 2017-05-03 DIAGNOSIS — N938 Other specified abnormal uterine and vaginal bleeding: Secondary | ICD-10-CM | POA: Diagnosis not present

## 2017-05-03 DIAGNOSIS — R10819 Abdominal tenderness, unspecified site: Secondary | ICD-10-CM | POA: Diagnosis not present

## 2017-05-03 DIAGNOSIS — N945 Secondary dysmenorrhea: Secondary | ICD-10-CM | POA: Diagnosis not present

## 2017-05-14 DIAGNOSIS — Z01818 Encounter for other preprocedural examination: Secondary | ICD-10-CM | POA: Diagnosis not present

## 2017-05-14 DIAGNOSIS — E119 Type 2 diabetes mellitus without complications: Secondary | ICD-10-CM | POA: Diagnosis not present

## 2017-05-14 DIAGNOSIS — F17210 Nicotine dependence, cigarettes, uncomplicated: Secondary | ICD-10-CM | POA: Diagnosis not present

## 2017-05-14 DIAGNOSIS — N921 Excessive and frequent menstruation with irregular cycle: Secondary | ICD-10-CM | POA: Diagnosis not present

## 2017-05-14 DIAGNOSIS — Z7984 Long term (current) use of oral hypoglycemic drugs: Secondary | ICD-10-CM | POA: Diagnosis not present

## 2017-05-16 DIAGNOSIS — N946 Dysmenorrhea, unspecified: Secondary | ICD-10-CM | POA: Diagnosis not present

## 2017-05-16 DIAGNOSIS — N938 Other specified abnormal uterine and vaginal bleeding: Secondary | ICD-10-CM | POA: Diagnosis not present

## 2017-05-16 DIAGNOSIS — D259 Leiomyoma of uterus, unspecified: Secondary | ICD-10-CM | POA: Diagnosis not present

## 2017-05-16 DIAGNOSIS — I10 Essential (primary) hypertension: Secondary | ICD-10-CM | POA: Diagnosis not present

## 2017-05-16 DIAGNOSIS — Z8249 Family history of ischemic heart disease and other diseases of the circulatory system: Secondary | ICD-10-CM | POA: Diagnosis not present

## 2017-05-16 DIAGNOSIS — Z79899 Other long term (current) drug therapy: Secondary | ICD-10-CM | POA: Diagnosis not present

## 2017-05-16 DIAGNOSIS — E119 Type 2 diabetes mellitus without complications: Secondary | ICD-10-CM | POA: Diagnosis not present

## 2017-05-16 DIAGNOSIS — Z7984 Long term (current) use of oral hypoglycemic drugs: Secondary | ICD-10-CM | POA: Diagnosis not present

## 2017-05-16 DIAGNOSIS — N800 Endometriosis of uterus: Secondary | ICD-10-CM | POA: Diagnosis not present

## 2017-05-16 DIAGNOSIS — N920 Excessive and frequent menstruation with regular cycle: Secondary | ICD-10-CM | POA: Diagnosis not present

## 2017-05-16 DIAGNOSIS — K219 Gastro-esophageal reflux disease without esophagitis: Secondary | ICD-10-CM | POA: Diagnosis not present

## 2017-05-16 DIAGNOSIS — Z833 Family history of diabetes mellitus: Secondary | ICD-10-CM | POA: Diagnosis not present

## 2017-05-16 DIAGNOSIS — F17200 Nicotine dependence, unspecified, uncomplicated: Secondary | ICD-10-CM | POA: Diagnosis not present

## 2017-05-16 DIAGNOSIS — E785 Hyperlipidemia, unspecified: Secondary | ICD-10-CM | POA: Diagnosis not present

## 2017-05-16 DIAGNOSIS — J45909 Unspecified asthma, uncomplicated: Secondary | ICD-10-CM | POA: Diagnosis not present

## 2017-05-18 DIAGNOSIS — E119 Type 2 diabetes mellitus without complications: Secondary | ICD-10-CM | POA: Diagnosis not present

## 2017-05-18 DIAGNOSIS — E7800 Pure hypercholesterolemia, unspecified: Secondary | ICD-10-CM | POA: Diagnosis not present

## 2017-05-18 DIAGNOSIS — L03818 Cellulitis of other sites: Secondary | ICD-10-CM | POA: Diagnosis not present

## 2017-05-18 DIAGNOSIS — I10 Essential (primary) hypertension: Secondary | ICD-10-CM | POA: Diagnosis not present

## 2017-05-18 DIAGNOSIS — Z79899 Other long term (current) drug therapy: Secondary | ICD-10-CM | POA: Diagnosis not present

## 2017-05-18 DIAGNOSIS — F17200 Nicotine dependence, unspecified, uncomplicated: Secondary | ICD-10-CM | POA: Diagnosis not present

## 2017-05-18 DIAGNOSIS — R109 Unspecified abdominal pain: Secondary | ICD-10-CM | POA: Diagnosis not present

## 2017-05-18 DIAGNOSIS — Z90710 Acquired absence of both cervix and uterus: Secondary | ICD-10-CM | POA: Diagnosis not present

## 2017-05-18 DIAGNOSIS — Z7984 Long term (current) use of oral hypoglycemic drugs: Secondary | ICD-10-CM | POA: Diagnosis not present

## 2017-05-18 DIAGNOSIS — K219 Gastro-esophageal reflux disease without esophagitis: Secondary | ICD-10-CM | POA: Diagnosis not present

## 2017-05-21 DIAGNOSIS — R10819 Abdominal tenderness, unspecified site: Secondary | ICD-10-CM | POA: Diagnosis not present

## 2017-05-21 DIAGNOSIS — N945 Secondary dysmenorrhea: Secondary | ICD-10-CM | POA: Diagnosis not present

## 2017-05-21 DIAGNOSIS — T8189XA Other complications of procedures, not elsewhere classified, initial encounter: Secondary | ICD-10-CM | POA: Diagnosis not present

## 2017-06-04 DIAGNOSIS — N200 Calculus of kidney: Secondary | ICD-10-CM | POA: Diagnosis not present

## 2017-06-04 DIAGNOSIS — T8131XA Disruption of external operation (surgical) wound, not elsewhere classified, initial encounter: Secondary | ICD-10-CM | POA: Diagnosis not present

## 2017-06-05 DIAGNOSIS — K439 Ventral hernia without obstruction or gangrene: Secondary | ICD-10-CM | POA: Diagnosis not present

## 2017-06-05 DIAGNOSIS — E119 Type 2 diabetes mellitus without complications: Secondary | ICD-10-CM | POA: Diagnosis not present

## 2017-06-05 DIAGNOSIS — Z79899 Other long term (current) drug therapy: Secondary | ICD-10-CM | POA: Diagnosis not present

## 2017-06-05 DIAGNOSIS — F17200 Nicotine dependence, unspecified, uncomplicated: Secondary | ICD-10-CM | POA: Diagnosis not present

## 2017-06-05 DIAGNOSIS — K219 Gastro-esophageal reflux disease without esophagitis: Secondary | ICD-10-CM | POA: Diagnosis not present

## 2017-06-05 DIAGNOSIS — E785 Hyperlipidemia, unspecified: Secondary | ICD-10-CM | POA: Diagnosis not present

## 2017-06-05 DIAGNOSIS — R1030 Lower abdominal pain, unspecified: Secondary | ICD-10-CM | POA: Diagnosis not present

## 2017-06-05 DIAGNOSIS — Z7984 Long term (current) use of oral hypoglycemic drugs: Secondary | ICD-10-CM | POA: Diagnosis not present

## 2017-06-05 DIAGNOSIS — T8131XA Disruption of external operation (surgical) wound, not elsewhere classified, initial encounter: Secondary | ICD-10-CM | POA: Diagnosis not present

## 2017-06-05 DIAGNOSIS — D649 Anemia, unspecified: Secondary | ICD-10-CM | POA: Diagnosis not present

## 2017-06-05 DIAGNOSIS — T8189XA Other complications of procedures, not elsewhere classified, initial encounter: Secondary | ICD-10-CM | POA: Diagnosis not present

## 2017-06-05 DIAGNOSIS — D62 Acute posthemorrhagic anemia: Secondary | ICD-10-CM | POA: Diagnosis not present

## 2017-06-05 DIAGNOSIS — M6281 Muscle weakness (generalized): Secondary | ICD-10-CM | POA: Diagnosis not present

## 2017-06-05 DIAGNOSIS — I10 Essential (primary) hypertension: Secondary | ICD-10-CM | POA: Diagnosis not present

## 2017-06-05 DIAGNOSIS — Z6836 Body mass index (BMI) 36.0-36.9, adult: Secondary | ICD-10-CM | POA: Diagnosis not present

## 2017-06-05 DIAGNOSIS — K660 Peritoneal adhesions (postprocedural) (postinfection): Secondary | ICD-10-CM | POA: Diagnosis not present

## 2017-06-05 DIAGNOSIS — K654 Sclerosing mesenteritis: Secondary | ICD-10-CM | POA: Diagnosis not present

## 2017-06-05 DIAGNOSIS — K760 Fatty (change of) liver, not elsewhere classified: Secondary | ICD-10-CM | POA: Diagnosis not present

## 2017-06-05 DIAGNOSIS — T8132XA Disruption of internal operation (surgical) wound, not elsewhere classified, initial encounter: Secondary | ICD-10-CM | POA: Diagnosis not present

## 2017-06-05 DIAGNOSIS — K5900 Constipation, unspecified: Secondary | ICD-10-CM | POA: Diagnosis not present

## 2017-06-05 DIAGNOSIS — N200 Calculus of kidney: Secondary | ICD-10-CM | POA: Diagnosis not present

## 2017-06-06 DIAGNOSIS — T8132XA Disruption of internal operation (surgical) wound, not elsewhere classified, initial encounter: Secondary | ICD-10-CM | POA: Diagnosis not present

## 2017-06-06 DIAGNOSIS — K439 Ventral hernia without obstruction or gangrene: Secondary | ICD-10-CM | POA: Diagnosis not present

## 2017-06-06 DIAGNOSIS — E119 Type 2 diabetes mellitus without complications: Secondary | ICD-10-CM | POA: Diagnosis not present

## 2017-06-06 DIAGNOSIS — D62 Acute posthemorrhagic anemia: Secondary | ICD-10-CM | POA: Diagnosis not present

## 2017-06-07 DIAGNOSIS — R1030 Lower abdominal pain, unspecified: Secondary | ICD-10-CM | POA: Diagnosis not present

## 2017-06-08 DIAGNOSIS — T8189XA Other complications of procedures, not elsewhere classified, initial encounter: Secondary | ICD-10-CM | POA: Diagnosis not present

## 2017-06-09 DIAGNOSIS — M6281 Muscle weakness (generalized): Secondary | ICD-10-CM | POA: Diagnosis not present

## 2017-06-09 DIAGNOSIS — T8132XA Disruption of internal operation (surgical) wound, not elsewhere classified, initial encounter: Secondary | ICD-10-CM | POA: Diagnosis not present

## 2017-06-09 DIAGNOSIS — K439 Ventral hernia without obstruction or gangrene: Secondary | ICD-10-CM | POA: Diagnosis not present

## 2017-06-09 DIAGNOSIS — D62 Acute posthemorrhagic anemia: Secondary | ICD-10-CM | POA: Diagnosis not present

## 2017-06-09 DIAGNOSIS — T8189XA Other complications of procedures, not elsewhere classified, initial encounter: Secondary | ICD-10-CM | POA: Diagnosis not present

## 2017-06-09 DIAGNOSIS — E119 Type 2 diabetes mellitus without complications: Secondary | ICD-10-CM | POA: Diagnosis not present

## 2017-06-09 DIAGNOSIS — T8131XA Disruption of external operation (surgical) wound, not elsewhere classified, initial encounter: Secondary | ICD-10-CM | POA: Diagnosis not present

## 2017-06-10 DIAGNOSIS — T8189XA Other complications of procedures, not elsewhere classified, initial encounter: Secondary | ICD-10-CM | POA: Diagnosis not present

## 2017-06-11 DIAGNOSIS — T8189XA Other complications of procedures, not elsewhere classified, initial encounter: Secondary | ICD-10-CM | POA: Diagnosis not present

## 2017-06-11 DIAGNOSIS — E785 Hyperlipidemia, unspecified: Secondary | ICD-10-CM | POA: Diagnosis not present

## 2017-06-11 DIAGNOSIS — E119 Type 2 diabetes mellitus without complications: Secondary | ICD-10-CM | POA: Diagnosis not present

## 2017-06-11 DIAGNOSIS — K760 Fatty (change of) liver, not elsewhere classified: Secondary | ICD-10-CM | POA: Diagnosis not present

## 2017-06-11 DIAGNOSIS — Z90710 Acquired absence of both cervix and uterus: Secondary | ICD-10-CM | POA: Diagnosis not present

## 2017-06-11 DIAGNOSIS — T8130XD Disruption of wound, unspecified, subsequent encounter: Secondary | ICD-10-CM | POA: Diagnosis not present

## 2017-06-11 DIAGNOSIS — F17210 Nicotine dependence, cigarettes, uncomplicated: Secondary | ICD-10-CM | POA: Diagnosis not present

## 2017-06-11 DIAGNOSIS — D649 Anemia, unspecified: Secondary | ICD-10-CM | POA: Diagnosis not present

## 2017-06-11 DIAGNOSIS — Z79891 Long term (current) use of opiate analgesic: Secondary | ICD-10-CM | POA: Diagnosis not present

## 2017-06-12 DIAGNOSIS — T8189XA Other complications of procedures, not elsewhere classified, initial encounter: Secondary | ICD-10-CM | POA: Diagnosis not present

## 2017-06-12 DIAGNOSIS — T8130XD Disruption of wound, unspecified, subsequent encounter: Secondary | ICD-10-CM | POA: Diagnosis not present

## 2017-06-13 DIAGNOSIS — T8132XD Disruption of internal operation (surgical) wound, not elsewhere classified, subsequent encounter: Secondary | ICD-10-CM | POA: Diagnosis not present

## 2017-06-13 DIAGNOSIS — T8189XA Other complications of procedures, not elsewhere classified, initial encounter: Secondary | ICD-10-CM | POA: Diagnosis not present

## 2017-06-14 DIAGNOSIS — Z9889 Other specified postprocedural states: Secondary | ICD-10-CM | POA: Diagnosis not present

## 2017-06-14 DIAGNOSIS — T8189XA Other complications of procedures, not elsewhere classified, initial encounter: Secondary | ICD-10-CM | POA: Diagnosis not present

## 2017-06-15 DIAGNOSIS — Z79891 Long term (current) use of opiate analgesic: Secondary | ICD-10-CM | POA: Diagnosis not present

## 2017-06-15 DIAGNOSIS — E119 Type 2 diabetes mellitus without complications: Secondary | ICD-10-CM | POA: Diagnosis not present

## 2017-06-15 DIAGNOSIS — E785 Hyperlipidemia, unspecified: Secondary | ICD-10-CM | POA: Diagnosis not present

## 2017-06-15 DIAGNOSIS — F17210 Nicotine dependence, cigarettes, uncomplicated: Secondary | ICD-10-CM | POA: Diagnosis not present

## 2017-06-15 DIAGNOSIS — K760 Fatty (change of) liver, not elsewhere classified: Secondary | ICD-10-CM | POA: Diagnosis not present

## 2017-06-15 DIAGNOSIS — T8130XD Disruption of wound, unspecified, subsequent encounter: Secondary | ICD-10-CM | POA: Diagnosis not present

## 2017-06-15 DIAGNOSIS — T8189XA Other complications of procedures, not elsewhere classified, initial encounter: Secondary | ICD-10-CM | POA: Diagnosis not present

## 2017-06-15 DIAGNOSIS — Z90710 Acquired absence of both cervix and uterus: Secondary | ICD-10-CM | POA: Diagnosis not present

## 2017-06-15 DIAGNOSIS — D649 Anemia, unspecified: Secondary | ICD-10-CM | POA: Diagnosis not present

## 2017-06-16 DIAGNOSIS — T8189XA Other complications of procedures, not elsewhere classified, initial encounter: Secondary | ICD-10-CM | POA: Diagnosis not present

## 2017-06-17 DIAGNOSIS — T8189XA Other complications of procedures, not elsewhere classified, initial encounter: Secondary | ICD-10-CM | POA: Diagnosis not present

## 2017-06-18 DIAGNOSIS — F17210 Nicotine dependence, cigarettes, uncomplicated: Secondary | ICD-10-CM | POA: Diagnosis not present

## 2017-06-18 DIAGNOSIS — E785 Hyperlipidemia, unspecified: Secondary | ICD-10-CM | POA: Diagnosis not present

## 2017-06-18 DIAGNOSIS — Z4889 Encounter for other specified surgical aftercare: Secondary | ICD-10-CM | POA: Diagnosis not present

## 2017-06-18 DIAGNOSIS — M6281 Muscle weakness (generalized): Secondary | ICD-10-CM | POA: Diagnosis not present

## 2017-06-18 DIAGNOSIS — Z90710 Acquired absence of both cervix and uterus: Secondary | ICD-10-CM | POA: Diagnosis not present

## 2017-06-18 DIAGNOSIS — T8130XD Disruption of wound, unspecified, subsequent encounter: Secondary | ICD-10-CM | POA: Diagnosis not present

## 2017-06-18 DIAGNOSIS — D649 Anemia, unspecified: Secondary | ICD-10-CM | POA: Diagnosis not present

## 2017-06-18 DIAGNOSIS — K760 Fatty (change of) liver, not elsewhere classified: Secondary | ICD-10-CM | POA: Diagnosis not present

## 2017-06-18 DIAGNOSIS — E119 Type 2 diabetes mellitus without complications: Secondary | ICD-10-CM | POA: Diagnosis not present

## 2017-06-18 DIAGNOSIS — T8189XA Other complications of procedures, not elsewhere classified, initial encounter: Secondary | ICD-10-CM | POA: Diagnosis not present

## 2017-06-18 DIAGNOSIS — Z79891 Long term (current) use of opiate analgesic: Secondary | ICD-10-CM | POA: Diagnosis not present

## 2017-06-18 DIAGNOSIS — T8131XA Disruption of external operation (surgical) wound, not elsewhere classified, initial encounter: Secondary | ICD-10-CM | POA: Diagnosis not present

## 2017-06-19 DIAGNOSIS — T8189XA Other complications of procedures, not elsewhere classified, initial encounter: Secondary | ICD-10-CM | POA: Diagnosis not present

## 2017-06-20 DIAGNOSIS — E119 Type 2 diabetes mellitus without complications: Secondary | ICD-10-CM | POA: Diagnosis not present

## 2017-06-20 DIAGNOSIS — I10 Essential (primary) hypertension: Secondary | ICD-10-CM | POA: Diagnosis not present

## 2017-06-20 DIAGNOSIS — Z79899 Other long term (current) drug therapy: Secondary | ICD-10-CM | POA: Diagnosis not present

## 2017-06-20 DIAGNOSIS — Y838 Other surgical procedures as the cause of abnormal reaction of the patient, or of later complication, without mention of misadventure at the time of the procedure: Secondary | ICD-10-CM | POA: Diagnosis not present

## 2017-06-20 DIAGNOSIS — Z90710 Acquired absence of both cervix and uterus: Secondary | ICD-10-CM | POA: Diagnosis not present

## 2017-06-20 DIAGNOSIS — Z91048 Other nonmedicinal substance allergy status: Secondary | ICD-10-CM | POA: Diagnosis not present

## 2017-06-20 DIAGNOSIS — T8132XD Disruption of internal operation (surgical) wound, not elsewhere classified, subsequent encounter: Secondary | ICD-10-CM | POA: Diagnosis not present

## 2017-06-20 DIAGNOSIS — Z7984 Long term (current) use of oral hypoglycemic drugs: Secondary | ICD-10-CM | POA: Diagnosis not present

## 2017-06-20 DIAGNOSIS — T8189XA Other complications of procedures, not elsewhere classified, initial encounter: Secondary | ICD-10-CM | POA: Diagnosis not present

## 2017-06-21 DIAGNOSIS — T8189XA Other complications of procedures, not elsewhere classified, initial encounter: Secondary | ICD-10-CM | POA: Diagnosis not present

## 2017-06-21 DIAGNOSIS — T8130XD Disruption of wound, unspecified, subsequent encounter: Secondary | ICD-10-CM | POA: Diagnosis not present

## 2017-06-22 DIAGNOSIS — T8189XA Other complications of procedures, not elsewhere classified, initial encounter: Secondary | ICD-10-CM | POA: Diagnosis not present

## 2017-06-22 DIAGNOSIS — E119 Type 2 diabetes mellitus without complications: Secondary | ICD-10-CM | POA: Diagnosis not present

## 2017-06-22 DIAGNOSIS — K760 Fatty (change of) liver, not elsewhere classified: Secondary | ICD-10-CM | POA: Diagnosis not present

## 2017-06-22 DIAGNOSIS — F17210 Nicotine dependence, cigarettes, uncomplicated: Secondary | ICD-10-CM | POA: Diagnosis not present

## 2017-06-22 DIAGNOSIS — D649 Anemia, unspecified: Secondary | ICD-10-CM | POA: Diagnosis not present

## 2017-06-22 DIAGNOSIS — E785 Hyperlipidemia, unspecified: Secondary | ICD-10-CM | POA: Diagnosis not present

## 2017-06-22 DIAGNOSIS — T8130XD Disruption of wound, unspecified, subsequent encounter: Secondary | ICD-10-CM | POA: Diagnosis not present

## 2017-06-22 DIAGNOSIS — Z79891 Long term (current) use of opiate analgesic: Secondary | ICD-10-CM | POA: Diagnosis not present

## 2017-06-22 DIAGNOSIS — Z90710 Acquired absence of both cervix and uterus: Secondary | ICD-10-CM | POA: Diagnosis not present

## 2017-06-23 DIAGNOSIS — T8189XA Other complications of procedures, not elsewhere classified, initial encounter: Secondary | ICD-10-CM | POA: Diagnosis not present

## 2017-06-24 DIAGNOSIS — T8189XA Other complications of procedures, not elsewhere classified, initial encounter: Secondary | ICD-10-CM | POA: Diagnosis not present

## 2017-06-25 DIAGNOSIS — T8189XA Other complications of procedures, not elsewhere classified, initial encounter: Secondary | ICD-10-CM | POA: Diagnosis not present

## 2017-06-25 DIAGNOSIS — R109 Unspecified abdominal pain: Secondary | ICD-10-CM | POA: Diagnosis not present

## 2017-06-26 DIAGNOSIS — E876 Hypokalemia: Secondary | ICD-10-CM | POA: Diagnosis not present

## 2017-06-26 DIAGNOSIS — E119 Type 2 diabetes mellitus without complications: Secondary | ICD-10-CM | POA: Diagnosis not present

## 2017-06-26 DIAGNOSIS — Z79899 Other long term (current) drug therapy: Secondary | ICD-10-CM | POA: Diagnosis not present

## 2017-06-26 DIAGNOSIS — T8131XA Disruption of external operation (surgical) wound, not elsewhere classified, initial encounter: Secondary | ICD-10-CM | POA: Diagnosis not present

## 2017-06-26 DIAGNOSIS — E785 Hyperlipidemia, unspecified: Secondary | ICD-10-CM | POA: Diagnosis not present

## 2017-06-26 DIAGNOSIS — T370X5A Adverse effect of sulfonamides, initial encounter: Secondary | ICD-10-CM | POA: Diagnosis not present

## 2017-06-26 DIAGNOSIS — T8189XA Other complications of procedures, not elsewhere classified, initial encounter: Secondary | ICD-10-CM | POA: Diagnosis not present

## 2017-06-26 DIAGNOSIS — N1339 Other hydronephrosis: Secondary | ICD-10-CM | POA: Diagnosis not present

## 2017-06-26 DIAGNOSIS — E871 Hypo-osmolality and hyponatremia: Secondary | ICD-10-CM | POA: Diagnosis not present

## 2017-06-26 DIAGNOSIS — Z6833 Body mass index (BMI) 33.0-33.9, adult: Secondary | ICD-10-CM | POA: Diagnosis not present

## 2017-06-26 DIAGNOSIS — N189 Chronic kidney disease, unspecified: Secondary | ICD-10-CM | POA: Diagnosis not present

## 2017-06-26 DIAGNOSIS — S31109A Unspecified open wound of abdominal wall, unspecified quadrant without penetration into peritoneal cavity, initial encounter: Secondary | ICD-10-CM | POA: Diagnosis not present

## 2017-06-26 DIAGNOSIS — N178 Other acute kidney failure: Secondary | ICD-10-CM | POA: Diagnosis not present

## 2017-06-26 DIAGNOSIS — F17200 Nicotine dependence, unspecified, uncomplicated: Secondary | ICD-10-CM | POA: Diagnosis not present

## 2017-06-26 DIAGNOSIS — J45909 Unspecified asthma, uncomplicated: Secondary | ICD-10-CM | POA: Diagnosis not present

## 2017-06-26 DIAGNOSIS — E860 Dehydration: Secondary | ICD-10-CM | POA: Diagnosis not present

## 2017-06-26 DIAGNOSIS — K219 Gastro-esophageal reflux disease without esophagitis: Secondary | ICD-10-CM | POA: Diagnosis not present

## 2017-06-26 DIAGNOSIS — I10 Essential (primary) hypertension: Secondary | ICD-10-CM | POA: Diagnosis not present

## 2017-06-26 DIAGNOSIS — N179 Acute kidney failure, unspecified: Secondary | ICD-10-CM | POA: Diagnosis not present

## 2017-06-26 DIAGNOSIS — K469 Unspecified abdominal hernia without obstruction or gangrene: Secondary | ICD-10-CM | POA: Diagnosis not present

## 2017-06-26 DIAGNOSIS — Z452 Encounter for adjustment and management of vascular access device: Secondary | ICD-10-CM | POA: Diagnosis not present

## 2017-06-26 DIAGNOSIS — Z7984 Long term (current) use of oral hypoglycemic drugs: Secondary | ICD-10-CM | POA: Diagnosis not present

## 2017-06-27 DIAGNOSIS — K469 Unspecified abdominal hernia without obstruction or gangrene: Secondary | ICD-10-CM | POA: Diagnosis not present

## 2017-06-27 DIAGNOSIS — E871 Hypo-osmolality and hyponatremia: Secondary | ICD-10-CM | POA: Diagnosis not present

## 2017-06-27 DIAGNOSIS — Z452 Encounter for adjustment and management of vascular access device: Secondary | ICD-10-CM | POA: Diagnosis not present

## 2017-06-27 DIAGNOSIS — T8131XA Disruption of external operation (surgical) wound, not elsewhere classified, initial encounter: Secondary | ICD-10-CM | POA: Diagnosis not present

## 2017-06-27 DIAGNOSIS — N178 Other acute kidney failure: Secondary | ICD-10-CM | POA: Diagnosis not present

## 2017-06-27 DIAGNOSIS — N179 Acute kidney failure, unspecified: Secondary | ICD-10-CM | POA: Diagnosis not present

## 2017-06-27 DIAGNOSIS — E860 Dehydration: Secondary | ICD-10-CM | POA: Diagnosis not present

## 2017-06-27 DIAGNOSIS — T8189XA Other complications of procedures, not elsewhere classified, initial encounter: Secondary | ICD-10-CM | POA: Diagnosis not present

## 2017-06-28 DIAGNOSIS — T8189XA Other complications of procedures, not elsewhere classified, initial encounter: Secondary | ICD-10-CM | POA: Diagnosis not present

## 2017-06-29 DIAGNOSIS — T8189XA Other complications of procedures, not elsewhere classified, initial encounter: Secondary | ICD-10-CM | POA: Diagnosis not present

## 2017-06-30 DIAGNOSIS — T8189XA Other complications of procedures, not elsewhere classified, initial encounter: Secondary | ICD-10-CM | POA: Diagnosis not present

## 2017-06-30 DIAGNOSIS — T8131XA Disruption of external operation (surgical) wound, not elsewhere classified, initial encounter: Secondary | ICD-10-CM | POA: Diagnosis not present

## 2017-06-30 DIAGNOSIS — S31109A Unspecified open wound of abdominal wall, unspecified quadrant without penetration into peritoneal cavity, initial encounter: Secondary | ICD-10-CM | POA: Diagnosis not present

## 2017-06-30 DIAGNOSIS — N179 Acute kidney failure, unspecified: Secondary | ICD-10-CM | POA: Diagnosis not present

## 2017-07-01 DIAGNOSIS — T8131XA Disruption of external operation (surgical) wound, not elsewhere classified, initial encounter: Secondary | ICD-10-CM | POA: Diagnosis not present

## 2017-07-01 DIAGNOSIS — N189 Chronic kidney disease, unspecified: Secondary | ICD-10-CM | POA: Diagnosis not present

## 2017-07-01 DIAGNOSIS — S31109A Unspecified open wound of abdominal wall, unspecified quadrant without penetration into peritoneal cavity, initial encounter: Secondary | ICD-10-CM | POA: Diagnosis not present

## 2017-07-01 DIAGNOSIS — T8189XA Other complications of procedures, not elsewhere classified, initial encounter: Secondary | ICD-10-CM | POA: Diagnosis not present

## 2017-07-02 DIAGNOSIS — T8189XA Other complications of procedures, not elsewhere classified, initial encounter: Secondary | ICD-10-CM | POA: Diagnosis not present

## 2017-07-02 DIAGNOSIS — E871 Hypo-osmolality and hyponatremia: Secondary | ICD-10-CM | POA: Diagnosis not present

## 2017-07-02 DIAGNOSIS — N178 Other acute kidney failure: Secondary | ICD-10-CM | POA: Diagnosis not present

## 2017-07-02 DIAGNOSIS — T8131XA Disruption of external operation (surgical) wound, not elsewhere classified, initial encounter: Secondary | ICD-10-CM | POA: Diagnosis not present

## 2017-07-03 DIAGNOSIS — T8189XA Other complications of procedures, not elsewhere classified, initial encounter: Secondary | ICD-10-CM | POA: Diagnosis not present

## 2017-07-04 DIAGNOSIS — T8189XA Other complications of procedures, not elsewhere classified, initial encounter: Secondary | ICD-10-CM | POA: Diagnosis not present

## 2017-07-04 DIAGNOSIS — T8132XD Disruption of internal operation (surgical) wound, not elsewhere classified, subsequent encounter: Secondary | ICD-10-CM | POA: Diagnosis not present

## 2017-07-05 DIAGNOSIS — T8189XA Other complications of procedures, not elsewhere classified, initial encounter: Secondary | ICD-10-CM | POA: Diagnosis not present

## 2017-07-06 DIAGNOSIS — T8189XA Other complications of procedures, not elsewhere classified, initial encounter: Secondary | ICD-10-CM | POA: Diagnosis not present

## 2017-07-07 DIAGNOSIS — T8189XA Other complications of procedures, not elsewhere classified, initial encounter: Secondary | ICD-10-CM | POA: Diagnosis not present

## 2017-07-12 ENCOUNTER — Encounter: Payer: Self-pay | Admitting: "Endocrinology

## 2017-07-12 DIAGNOSIS — Z6831 Body mass index (BMI) 31.0-31.9, adult: Secondary | ICD-10-CM | POA: Diagnosis not present

## 2017-07-12 DIAGNOSIS — N938 Other specified abnormal uterine and vaginal bleeding: Secondary | ICD-10-CM | POA: Diagnosis not present

## 2017-07-12 DIAGNOSIS — N178 Other acute kidney failure: Secondary | ICD-10-CM | POA: Diagnosis not present

## 2017-07-12 DIAGNOSIS — E0865 Diabetes mellitus due to underlying condition with hyperglycemia: Secondary | ICD-10-CM | POA: Diagnosis not present

## 2017-07-12 DIAGNOSIS — N202 Calculus of kidney with calculus of ureter: Secondary | ICD-10-CM | POA: Diagnosis not present

## 2017-07-16 DIAGNOSIS — E119 Type 2 diabetes mellitus without complications: Secondary | ICD-10-CM | POA: Diagnosis not present

## 2017-07-16 DIAGNOSIS — D649 Anemia, unspecified: Secondary | ICD-10-CM | POA: Diagnosis not present

## 2017-07-16 DIAGNOSIS — K760 Fatty (change of) liver, not elsewhere classified: Secondary | ICD-10-CM | POA: Diagnosis not present

## 2017-07-16 DIAGNOSIS — E785 Hyperlipidemia, unspecified: Secondary | ICD-10-CM | POA: Diagnosis not present

## 2017-07-16 DIAGNOSIS — F17210 Nicotine dependence, cigarettes, uncomplicated: Secondary | ICD-10-CM | POA: Diagnosis not present

## 2017-07-16 DIAGNOSIS — Z90710 Acquired absence of both cervix and uterus: Secondary | ICD-10-CM | POA: Diagnosis not present

## 2017-07-16 DIAGNOSIS — T8130XD Disruption of wound, unspecified, subsequent encounter: Secondary | ICD-10-CM | POA: Diagnosis not present

## 2017-07-16 DIAGNOSIS — Z79891 Long term (current) use of opiate analgesic: Secondary | ICD-10-CM | POA: Diagnosis not present

## 2017-07-20 DIAGNOSIS — Z90710 Acquired absence of both cervix and uterus: Secondary | ICD-10-CM | POA: Diagnosis not present

## 2017-07-20 DIAGNOSIS — T8130XD Disruption of wound, unspecified, subsequent encounter: Secondary | ICD-10-CM | POA: Diagnosis not present

## 2017-07-20 DIAGNOSIS — Z79891 Long term (current) use of opiate analgesic: Secondary | ICD-10-CM | POA: Diagnosis not present

## 2017-07-20 DIAGNOSIS — E119 Type 2 diabetes mellitus without complications: Secondary | ICD-10-CM | POA: Diagnosis not present

## 2017-07-20 DIAGNOSIS — E785 Hyperlipidemia, unspecified: Secondary | ICD-10-CM | POA: Diagnosis not present

## 2017-07-20 DIAGNOSIS — K760 Fatty (change of) liver, not elsewhere classified: Secondary | ICD-10-CM | POA: Diagnosis not present

## 2017-07-20 DIAGNOSIS — D649 Anemia, unspecified: Secondary | ICD-10-CM | POA: Diagnosis not present

## 2017-07-20 DIAGNOSIS — F17210 Nicotine dependence, cigarettes, uncomplicated: Secondary | ICD-10-CM | POA: Diagnosis not present

## 2017-07-24 DIAGNOSIS — T8131XA Disruption of external operation (surgical) wound, not elsewhere classified, initial encounter: Secondary | ICD-10-CM | POA: Diagnosis not present

## 2017-07-24 DIAGNOSIS — M6281 Muscle weakness (generalized): Secondary | ICD-10-CM | POA: Diagnosis not present

## 2017-07-24 DIAGNOSIS — T8189XA Other complications of procedures, not elsewhere classified, initial encounter: Secondary | ICD-10-CM | POA: Diagnosis not present

## 2017-07-24 DIAGNOSIS — Z4889 Encounter for other specified surgical aftercare: Secondary | ICD-10-CM | POA: Diagnosis not present

## 2017-09-10 DIAGNOSIS — Z6832 Body mass index (BMI) 32.0-32.9, adult: Secondary | ICD-10-CM | POA: Diagnosis not present

## 2017-09-10 DIAGNOSIS — T8132XD Disruption of internal operation (surgical) wound, not elsewhere classified, subsequent encounter: Secondary | ICD-10-CM | POA: Diagnosis not present

## 2017-09-22 DIAGNOSIS — E785 Hyperlipidemia, unspecified: Secondary | ICD-10-CM | POA: Diagnosis not present

## 2017-09-22 DIAGNOSIS — F17200 Nicotine dependence, unspecified, uncomplicated: Secondary | ICD-10-CM | POA: Diagnosis not present

## 2017-09-22 DIAGNOSIS — K210 Gastro-esophageal reflux disease with esophagitis: Secondary | ICD-10-CM | POA: Diagnosis not present

## 2017-09-22 DIAGNOSIS — I129 Hypertensive chronic kidney disease with stage 1 through stage 4 chronic kidney disease, or unspecified chronic kidney disease: Secondary | ICD-10-CM | POA: Diagnosis not present

## 2017-09-22 DIAGNOSIS — M545 Low back pain: Secondary | ICD-10-CM | POA: Diagnosis not present

## 2017-09-22 DIAGNOSIS — Z794 Long term (current) use of insulin: Secondary | ICD-10-CM | POA: Diagnosis not present

## 2017-09-22 DIAGNOSIS — E212 Other hyperparathyroidism: Secondary | ICD-10-CM | POA: Diagnosis not present

## 2017-09-22 DIAGNOSIS — K219 Gastro-esophageal reflux disease without esophagitis: Secondary | ICD-10-CM | POA: Diagnosis not present

## 2017-09-22 DIAGNOSIS — E213 Hyperparathyroidism, unspecified: Secondary | ICD-10-CM | POA: Diagnosis not present

## 2017-09-22 DIAGNOSIS — Z6829 Body mass index (BMI) 29.0-29.9, adult: Secondary | ICD-10-CM | POA: Diagnosis not present

## 2017-09-22 DIAGNOSIS — N189 Chronic kidney disease, unspecified: Secondary | ICD-10-CM | POA: Diagnosis not present

## 2017-09-22 DIAGNOSIS — E1122 Type 2 diabetes mellitus with diabetic chronic kidney disease: Secondary | ICD-10-CM | POA: Diagnosis not present

## 2017-09-22 DIAGNOSIS — Z79899 Other long term (current) drug therapy: Secondary | ICD-10-CM | POA: Diagnosis not present

## 2017-09-22 DIAGNOSIS — N390 Urinary tract infection, site not specified: Secondary | ICD-10-CM | POA: Diagnosis not present

## 2017-09-23 DIAGNOSIS — E213 Hyperparathyroidism, unspecified: Secondary | ICD-10-CM | POA: Diagnosis not present

## 2017-09-23 DIAGNOSIS — F17200 Nicotine dependence, unspecified, uncomplicated: Secondary | ICD-10-CM | POA: Diagnosis not present

## 2017-09-23 DIAGNOSIS — Z794 Long term (current) use of insulin: Secondary | ICD-10-CM | POA: Diagnosis not present

## 2017-09-23 DIAGNOSIS — N390 Urinary tract infection, site not specified: Secondary | ICD-10-CM | POA: Diagnosis not present

## 2017-09-23 DIAGNOSIS — E1122 Type 2 diabetes mellitus with diabetic chronic kidney disease: Secondary | ICD-10-CM | POA: Diagnosis not present

## 2017-09-23 DIAGNOSIS — Z6829 Body mass index (BMI) 29.0-29.9, adult: Secondary | ICD-10-CM | POA: Diagnosis not present

## 2017-09-23 DIAGNOSIS — Z79899 Other long term (current) drug therapy: Secondary | ICD-10-CM | POA: Diagnosis not present

## 2017-09-23 DIAGNOSIS — K210 Gastro-esophageal reflux disease with esophagitis: Secondary | ICD-10-CM | POA: Diagnosis not present

## 2017-09-23 DIAGNOSIS — N189 Chronic kidney disease, unspecified: Secondary | ICD-10-CM | POA: Diagnosis not present

## 2017-09-23 DIAGNOSIS — E212 Other hyperparathyroidism: Secondary | ICD-10-CM | POA: Diagnosis not present

## 2017-09-23 DIAGNOSIS — E785 Hyperlipidemia, unspecified: Secondary | ICD-10-CM | POA: Diagnosis not present

## 2017-09-23 DIAGNOSIS — K219 Gastro-esophageal reflux disease without esophagitis: Secondary | ICD-10-CM | POA: Diagnosis not present

## 2017-09-23 DIAGNOSIS — I129 Hypertensive chronic kidney disease with stage 1 through stage 4 chronic kidney disease, or unspecified chronic kidney disease: Secondary | ICD-10-CM | POA: Diagnosis not present

## 2017-09-24 DIAGNOSIS — K210 Gastro-esophageal reflux disease with esophagitis: Secondary | ICD-10-CM | POA: Diagnosis not present

## 2017-09-24 DIAGNOSIS — E212 Other hyperparathyroidism: Secondary | ICD-10-CM | POA: Diagnosis not present

## 2017-09-24 DIAGNOSIS — N390 Urinary tract infection, site not specified: Secondary | ICD-10-CM | POA: Diagnosis not present

## 2017-09-26 DIAGNOSIS — R008 Other abnormalities of heart beat: Secondary | ICD-10-CM | POA: Diagnosis not present

## 2017-10-22 DIAGNOSIS — T8132XD Disruption of internal operation (surgical) wound, not elsewhere classified, subsequent encounter: Secondary | ICD-10-CM | POA: Diagnosis not present

## 2017-10-22 DIAGNOSIS — Z6832 Body mass index (BMI) 32.0-32.9, adult: Secondary | ICD-10-CM | POA: Diagnosis not present

## 2017-10-22 DIAGNOSIS — Z6829 Body mass index (BMI) 29.0-29.9, adult: Secondary | ICD-10-CM | POA: Diagnosis not present

## 2017-10-22 DIAGNOSIS — Z1211 Encounter for screening for malignant neoplasm of colon: Secondary | ICD-10-CM | POA: Diagnosis not present

## 2017-10-30 DIAGNOSIS — Y838 Other surgical procedures as the cause of abnormal reaction of the patient, or of later complication, without mention of misadventure at the time of the procedure: Secondary | ICD-10-CM | POA: Diagnosis not present

## 2017-10-30 DIAGNOSIS — Z794 Long term (current) use of insulin: Secondary | ICD-10-CM | POA: Diagnosis not present

## 2017-10-30 DIAGNOSIS — E119 Type 2 diabetes mellitus without complications: Secondary | ICD-10-CM | POA: Diagnosis not present

## 2017-10-30 DIAGNOSIS — R112 Nausea with vomiting, unspecified: Secondary | ICD-10-CM | POA: Diagnosis not present

## 2017-10-30 DIAGNOSIS — Z79899 Other long term (current) drug therapy: Secondary | ICD-10-CM | POA: Diagnosis not present

## 2017-10-30 DIAGNOSIS — I10 Essential (primary) hypertension: Secondary | ICD-10-CM | POA: Diagnosis not present

## 2017-10-30 DIAGNOSIS — Z90710 Acquired absence of both cervix and uterus: Secondary | ICD-10-CM | POA: Diagnosis not present

## 2017-10-30 DIAGNOSIS — K219 Gastro-esophageal reflux disease without esophagitis: Secondary | ICD-10-CM | POA: Diagnosis not present

## 2017-10-30 DIAGNOSIS — T8131XA Disruption of external operation (surgical) wound, not elsewhere classified, initial encounter: Secondary | ICD-10-CM | POA: Diagnosis not present

## 2017-10-30 DIAGNOSIS — M436 Torticollis: Secondary | ICD-10-CM | POA: Diagnosis not present

## 2017-10-30 DIAGNOSIS — F17200 Nicotine dependence, unspecified, uncomplicated: Secondary | ICD-10-CM | POA: Diagnosis not present

## 2017-10-30 DIAGNOSIS — M542 Cervicalgia: Secondary | ICD-10-CM | POA: Diagnosis not present

## 2017-10-30 DIAGNOSIS — R51 Headache: Secondary | ICD-10-CM | POA: Diagnosis not present

## 2017-10-31 DIAGNOSIS — H5213 Myopia, bilateral: Secondary | ICD-10-CM | POA: Diagnosis not present

## 2017-10-31 DIAGNOSIS — Z794 Long term (current) use of insulin: Secondary | ICD-10-CM | POA: Diagnosis not present

## 2017-10-31 DIAGNOSIS — H52223 Regular astigmatism, bilateral: Secondary | ICD-10-CM | POA: Diagnosis not present

## 2017-10-31 DIAGNOSIS — E119 Type 2 diabetes mellitus without complications: Secondary | ICD-10-CM | POA: Diagnosis not present

## 2017-12-13 ENCOUNTER — Encounter: Payer: Self-pay | Admitting: "Endocrinology

## 2017-12-13 ENCOUNTER — Ambulatory Visit (INDEPENDENT_AMBULATORY_CARE_PROVIDER_SITE_OTHER): Payer: BLUE CROSS/BLUE SHIELD | Admitting: "Endocrinology

## 2017-12-13 DIAGNOSIS — E049 Nontoxic goiter, unspecified: Secondary | ICD-10-CM | POA: Insufficient documentation

## 2017-12-13 NOTE — Progress Notes (Signed)
Consult Note       12/13/2017, 2:28 PM  Traci Zimmerman is a 52 y.o.-year-old female, referred by her PCP, Dr. Olena Leatherwood, for evaluation for hypercalcemia.   Past Medical History:  Diagnosis Date  . Diabetes mellitus without complication (HCC)   . Kidney stone     Past Surgical History:  Procedure Laterality Date  . CESAREAN SECTION    . LITHOTRIPSY    . TUBAL LIGATION      Social History   Tobacco Use  . Smoking status: Former Smoker  Substance Use Topics  . Alcohol use: No    Comment: stopped smoking 25 days ago  . Drug use: No    Outpatient Encounter Medications as of 12/13/2017  Medication Sig  . furosemide (LASIX) 20 MG tablet Take 20 mg by mouth daily.  . hydrALAZINE (APRESOLINE) 25 MG tablet Take 25 mg by mouth 2 (two) times daily.  Marland Kitchen labetalol (NORMODYNE) 300 MG tablet Take 300 mg by mouth 2 (two) times daily.  . ondansetron (ZOFRAN) 4 MG tablet Take 4 mg by mouth every 6 (six) hours as needed. for nausea  . [DISCONTINUED] benazepril (LOTENSIN) 20 MG tablet Take 20 mg by mouth daily.  . [DISCONTINUED] cyclobenzaprine (FLEXERIL) 10 MG tablet Take 1 tablet (10 mg total) by mouth 3 (three) times daily as needed for muscle spasms.  . [DISCONTINUED] meloxicam (MOBIC) 15 MG tablet Take 1 tablet (15 mg total) by mouth daily.  . [DISCONTINUED] metFORMIN (GLUCOPHAGE) 1000 MG tablet Take 1,000 mg by mouth 2 (two) times daily.  . [DISCONTINUED] oxyCODONE-acetaminophen (PERCOCET) 5-325 MG tablet Take 1 tablet by mouth every 4 (four) hours as needed for moderate pain.  . [DISCONTINUED] oxyCODONE-acetaminophen (PERCOCET/ROXICET) 5-325 MG tablet Take 1 tablet by mouth every 4 (four) hours as needed.  . [DISCONTINUED] simvastatin (ZOCOR) 20 MG tablet Take 20 mg by mouth daily.   No facility-administered encounter medications on file as of 12/13/2017.     Allergies  Allergen Reactions  . Tape      HPI  Traci Zimmerman  was diagnosed with hypercalcemia in at least from 2014. I reviewed patient's pertinent labs:  Lab Results  Component Value Date   CALCIUM 10.5 (H) 07/11/2015   CALCIUM 11.9 (H) 08/14/2012   CALCIUM 11.9 (H) 12/08/2010     No prior history of fragility fractures or falls.  She has history of recurrent nephrolithiasis which required lithotripsy on 3 different occasions.   No history of CKD. Last BUN/Cr: Lab Results  Component Value Date   BUN 10 07/11/2015   CREATININE 0.69 07/11/2015   she is not on HCTZ or other thiazide therapy.  Her vitamin D status is not known.  She is not on any calcium supplements.  she  eats dairy and green, leafy, vegetables on average amounts. she reports family history of nephrolithiasis in 1 of her aunts, does not have a family history of hypercalcemia, pituitary tumors, thyroid cancer, or osteoporosis.   I reviewed her chart and she also has a history of type 2 diabetes, and hypertension.    ROS:  Constitutional: +  weight loss, + fatigue, no subjective hyperthermia, no subjective  hypothermia Eyes: no blurry vision, no xerophthalmia ENT: no sore throat, no nodules palpated in throat, no dysphagia/odynophagia, no hoarseness Cardiovascular: no Chest Pain, no Shortness of Breath, no palpitations, no leg swelling Respiratory: no cough, no SOB Gastrointestinal: no Nausea/Vomiting/Diarhhea Musculoskeletal: no muscle/joint aches Skin: no rashes Neurological: no tremors, no numbness, no tingling, no dizziness Psychiatric: no depression, no anxiety  PE: BP 138/88   Pulse 74   Ht 5\' 7"  (1.702 m)   Wt 194 lb (88 kg)   BMI 30.38 kg/m  Wt Readings from Last 3 Encounters:  12/13/17 194 lb (88 kg)  03/05/15 223 lb (101.2 kg)  08/14/12 221 lb (100.2 kg)   Constitutional: + Obese for height, not in acute distress, normal state of mind Eyes: PERRLA, EOMI, no exophthalmos ENT: moist mucous membranes, + thyromegaly, no cervical  lymphadenopathy Cardiovascular: normal precordial activity, Regular Rate and Rhythm, no Murmur/Rubs/Gallops Respiratory:  adequate breathing efforts, no gross chest deformity, Clear to auscultation bilaterally Gastrointestinal: abdomen soft, Non -tender, No distension, Bowel Sounds present Musculoskeletal: no gross deformities, strength intact in all four extremities Skin: moist, warm, no rashes Neurological: no tremor with outstretched hands, Deep tendon reflexes normal in all four extremities.   CMP     Component Value Date/Time   NA 137 07/11/2015 0130   K 3.5 07/11/2015 0130   CL 108 07/11/2015 0130   CO2 20 (L) 07/11/2015 0130   GLUCOSE 229 (H) 07/11/2015 0130   BUN 10 07/11/2015 0130   CREATININE 0.69 07/11/2015 0130   CALCIUM 10.5 (H) 07/11/2015 0130   PROT 7.1 07/11/2015 0130   ALBUMIN 3.9 07/11/2015 0130   AST 37 07/11/2015 0130   ALT 36 07/11/2015 0130   ALKPHOS 210 (H) 07/11/2015 0130   BILITOT 0.4 07/11/2015 0130   GFRNONAA >60 07/11/2015 0130   GFRAA >60 07/11/2015 0130   Calcium from July 2014 was 11.9, May 2017 was 10.5, May 2019 was 13.2   Assessment: 1. Hypercalcemia 2. Goiter  Plan: Patient has had several instances of elevated calcium, with the highest level being at 13.2 mg/dL.  He has no measurement of corresponding PTH.   -Her vitamin D status is not known.    -She has history of recurrent nephrolithiasis which required lithotripsy on 3 different occasions, denies history of osteoporosis, fragility fractures, abdominal pain   She denies major mood disorders, reports on and off constipation. - I discussed with the patient about the physiology of calcium and parathyroid hormone, and possible  effects of  increased PTH/ Calcium , including kidney stones, cardiac dysrhythmias, osteoporosis, abdominal pain, etc.   - The work up so far is not sufficient to reach a conclusion for definitive therapy.  she  needs more studies to confirm and classify the  parathyroid dysfunction she may have. I will proceed to obtain  Repeat calcium with intact PTH, serum magnesium, serum phosphorus, will ask vitamin D panel.    It is also essential to obtain 24-hour urine calcium/creatinine to rule out the rare but important cause of mild elevation in calcium and PTH- FHH ( Familial Hypocalciuric Hypercalcemia), which may not require any active intervention.  - I will request for her next DEXA scan on subsequent visits to include the distal  33% of  radius for evaluation of cortical bone, which is predominantly affected by hyperparathyroidism.   -Rectal exam revealed significant enlargement of her thyroid, will request for thyroid/neck ultrasound.  -She will return in 3 weeks to discuss these results.  If she is  confirmed to have primary parathyroidism, she is a surgical candidate .   - Time spent with the patient: 30 minutes, of which >50% was spent in obtaining information about her symptoms, reviewing her previous labs, evaluations, and treatments, counseling her about her chronic hypercalcemia complicated by recurrent nephrolithiasis, goiter, and developing a plan to confirm the diagnosis and long term treatment as necessary.  Burnis Medin participated in the discussions, expressed understanding, and voiced agreement with the above plans.  All questions were answered to her satisfaction. she is encouraged to contact clinic should she have any questions or concerns prior to her return visit.  - Return in about 3 weeks (around 01/03/2018) for 24 Hours Urine Calcium and Creatinine, Follow up with Pre-visit Labs, Thyroid / Neck Ultrasound.   Marquis Lunch, MD [facility] [address] [place], [place] [address] Phone: [phone]  Fax: [phone]    This note was partially dictated with voice recognition software. Similar sounding words can be transcribed inadequately or may not  be corrected upon  review.  12/13/2017, 2:28 PM

## 2017-12-20 LAB — COMPLETE METABOLIC PANEL WITH GFR
AG Ratio: 1.5 (calc) (ref 1.0–2.5)
ALKALINE PHOSPHATASE (APISO): 268 U/L — AB (ref 33–130)
ALT: 27 U/L (ref 6–29)
AST: 23 U/L (ref 10–35)
Albumin: 4.2 g/dL (ref 3.6–5.1)
BUN/Creatinine Ratio: 13 (calc) (ref 6–22)
BUN: 16 mg/dL (ref 7–25)
CO2: 22 mmol/L (ref 20–32)
CREATININE: 1.27 mg/dL — AB (ref 0.50–1.05)
Calcium: 12.5 mg/dL — ABNORMAL HIGH (ref 8.6–10.4)
Chloride: 111 mmol/L — ABNORMAL HIGH (ref 98–110)
GFR, Est African American: 56 mL/min/{1.73_m2} — ABNORMAL LOW (ref >=60)
GFR, Est Non African American: 48 mL/min/{1.73_m2} — ABNORMAL LOW (ref >=60)
GLOBULIN: 2.8 g/dL (ref 1.9–3.7)
Glucose, Bld: 106 mg/dL — ABNORMAL HIGH (ref 65–99)
Potassium: 4.5 mmol/L (ref 3.5–5.3)
SODIUM: 139 mmol/L (ref 135–146)
Total Bilirubin: 0.3 mg/dL (ref 0.2–1.2)
Total Protein: 7 g/dL (ref 6.1–8.1)

## 2017-12-20 LAB — VITAMIN D 1,25 DIHYDROXY
Vitamin D 1, 25 (OH)2 Total: 83 pg/mL — ABNORMAL HIGH (ref 18–72)
Vitamin D3 1, 25 (OH)2: 83 pg/mL

## 2017-12-20 LAB — CREATININE, URINE, 24 HOUR: CREATININE 24H UR: 1.25 g/(24.h) (ref 0.50–2.15)

## 2017-12-20 LAB — PTH, INTACT AND CALCIUM
Calcium: 12.5 mg/dL — ABNORMAL HIGH (ref 8.6–10.4)
PTH: 188 pg/mL — AB (ref 14–64)

## 2017-12-20 LAB — VITAMIN D 25 HYDROXY (VIT D DEFICIENCY, FRACTURES): VIT D 25 HYDROXY: 8 ng/mL — AB (ref 30–100)

## 2017-12-20 LAB — MAGNESIUM: Magnesium: 1.9 mg/dL (ref 1.5–2.5)

## 2017-12-20 LAB — CALCIUM, URINE, 24 HOUR: CALCIUM 24H UR: 312 mg/(24.h) — AB

## 2017-12-20 LAB — PHOSPHORUS: Phosphorus: 2.6 mg/dL (ref 2.5–4.5)

## 2018-01-16 ENCOUNTER — Ambulatory Visit: Payer: BLUE CROSS/BLUE SHIELD | Admitting: "Endocrinology

## 2018-04-15 DIAGNOSIS — N200 Calculus of kidney: Secondary | ICD-10-CM | POA: Diagnosis not present

## 2018-04-15 DIAGNOSIS — K5900 Constipation, unspecified: Secondary | ICD-10-CM | POA: Diagnosis not present

## 2018-04-18 DIAGNOSIS — N201 Calculus of ureter: Secondary | ICD-10-CM | POA: Diagnosis not present

## 2018-04-18 DIAGNOSIS — R109 Unspecified abdominal pain: Secondary | ICD-10-CM | POA: Diagnosis not present

## 2018-04-18 DIAGNOSIS — N139 Obstructive and reflux uropathy, unspecified: Secondary | ICD-10-CM | POA: Diagnosis not present

## 2018-04-24 DIAGNOSIS — R008 Other abnormalities of heart beat: Secondary | ICD-10-CM | POA: Diagnosis not present

## 2018-04-30 DIAGNOSIS — E213 Hyperparathyroidism, unspecified: Secondary | ICD-10-CM | POA: Diagnosis not present

## 2018-04-30 DIAGNOSIS — E042 Nontoxic multinodular goiter: Secondary | ICD-10-CM | POA: Diagnosis not present

## 2018-05-15 DIAGNOSIS — E210 Primary hyperparathyroidism: Secondary | ICD-10-CM | POA: Diagnosis not present

## 2018-05-15 DIAGNOSIS — E213 Hyperparathyroidism, unspecified: Secondary | ICD-10-CM | POA: Diagnosis not present

## 2018-05-15 DIAGNOSIS — E042 Nontoxic multinodular goiter: Secondary | ICD-10-CM | POA: Diagnosis not present

## 2018-05-15 DIAGNOSIS — Z6830 Body mass index (BMI) 30.0-30.9, adult: Secondary | ICD-10-CM | POA: Diagnosis not present

## 2018-05-15 DIAGNOSIS — E669 Obesity, unspecified: Secondary | ICD-10-CM | POA: Diagnosis not present

## 2018-05-15 DIAGNOSIS — Z79899 Other long term (current) drug therapy: Secondary | ICD-10-CM | POA: Diagnosis not present

## 2018-05-15 DIAGNOSIS — E1142 Type 2 diabetes mellitus with diabetic polyneuropathy: Secondary | ICD-10-CM | POA: Diagnosis not present

## 2018-05-16 DIAGNOSIS — E210 Primary hyperparathyroidism: Secondary | ICD-10-CM | POA: Diagnosis not present

## 2018-05-16 DIAGNOSIS — E1142 Type 2 diabetes mellitus with diabetic polyneuropathy: Secondary | ICD-10-CM | POA: Diagnosis not present

## 2018-05-16 DIAGNOSIS — Z79899 Other long term (current) drug therapy: Secondary | ICD-10-CM | POA: Diagnosis not present

## 2018-05-16 DIAGNOSIS — E669 Obesity, unspecified: Secondary | ICD-10-CM | POA: Diagnosis not present

## 2018-05-16 DIAGNOSIS — E042 Nontoxic multinodular goiter: Secondary | ICD-10-CM | POA: Diagnosis not present

## 2018-05-16 DIAGNOSIS — Z6830 Body mass index (BMI) 30.0-30.9, adult: Secondary | ICD-10-CM | POA: Diagnosis not present

## 2018-05-17 DIAGNOSIS — Z79899 Other long term (current) drug therapy: Secondary | ICD-10-CM | POA: Diagnosis not present

## 2018-05-17 DIAGNOSIS — E042 Nontoxic multinodular goiter: Secondary | ICD-10-CM | POA: Diagnosis not present

## 2018-05-17 DIAGNOSIS — E1142 Type 2 diabetes mellitus with diabetic polyneuropathy: Secondary | ICD-10-CM | POA: Diagnosis not present

## 2018-05-17 DIAGNOSIS — E669 Obesity, unspecified: Secondary | ICD-10-CM | POA: Diagnosis not present

## 2018-05-17 DIAGNOSIS — Z6830 Body mass index (BMI) 30.0-30.9, adult: Secondary | ICD-10-CM | POA: Diagnosis not present

## 2018-05-17 DIAGNOSIS — E210 Primary hyperparathyroidism: Secondary | ICD-10-CM | POA: Diagnosis not present

## 2018-05-22 DIAGNOSIS — E213 Hyperparathyroidism, unspecified: Secondary | ICD-10-CM | POA: Diagnosis not present

## 2018-05-27 DIAGNOSIS — E213 Hyperparathyroidism, unspecified: Secondary | ICD-10-CM | POA: Diagnosis not present

## 2018-05-28 DIAGNOSIS — N200 Calculus of kidney: Secondary | ICD-10-CM | POA: Diagnosis not present

## 2018-05-28 DIAGNOSIS — Z91048 Other nonmedicinal substance allergy status: Secondary | ICD-10-CM | POA: Diagnosis not present

## 2018-05-28 DIAGNOSIS — R109 Unspecified abdominal pain: Secondary | ICD-10-CM | POA: Diagnosis not present

## 2018-05-28 DIAGNOSIS — N201 Calculus of ureter: Secondary | ICD-10-CM | POA: Diagnosis not present

## 2018-05-28 DIAGNOSIS — E669 Obesity, unspecified: Secondary | ICD-10-CM | POA: Diagnosis not present

## 2018-05-28 DIAGNOSIS — Z6832 Body mass index (BMI) 32.0-32.9, adult: Secondary | ICD-10-CM | POA: Diagnosis not present

## 2018-05-28 DIAGNOSIS — N184 Chronic kidney disease, stage 4 (severe): Secondary | ICD-10-CM | POA: Diagnosis not present

## 2018-05-28 DIAGNOSIS — E1140 Type 2 diabetes mellitus with diabetic neuropathy, unspecified: Secondary | ICD-10-CM | POA: Diagnosis not present

## 2018-05-28 DIAGNOSIS — E890 Postprocedural hypothyroidism: Secondary | ICD-10-CM | POA: Diagnosis not present

## 2018-05-28 DIAGNOSIS — Z79899 Other long term (current) drug therapy: Secondary | ICD-10-CM | POA: Diagnosis not present

## 2018-05-28 DIAGNOSIS — E1122 Type 2 diabetes mellitus with diabetic chronic kidney disease: Secondary | ICD-10-CM | POA: Diagnosis not present

## 2018-05-28 DIAGNOSIS — Z87891 Personal history of nicotine dependence: Secondary | ICD-10-CM | POA: Diagnosis not present

## 2018-05-28 DIAGNOSIS — R1084 Generalized abdominal pain: Secondary | ICD-10-CM | POA: Diagnosis not present

## 2018-05-28 DIAGNOSIS — Z960 Presence of urogenital implants: Secondary | ICD-10-CM | POA: Diagnosis not present

## 2018-05-28 DIAGNOSIS — N132 Hydronephrosis with renal and ureteral calculous obstruction: Secondary | ICD-10-CM | POA: Diagnosis not present

## 2018-05-28 DIAGNOSIS — E875 Hyperkalemia: Secondary | ICD-10-CM | POA: Diagnosis not present

## 2018-05-28 DIAGNOSIS — I129 Hypertensive chronic kidney disease with stage 1 through stage 4 chronic kidney disease, or unspecified chronic kidney disease: Secondary | ICD-10-CM | POA: Diagnosis not present

## 2018-05-28 DIAGNOSIS — K449 Diaphragmatic hernia without obstruction or gangrene: Secondary | ICD-10-CM | POA: Diagnosis not present

## 2018-05-29 DIAGNOSIS — N200 Calculus of kidney: Secondary | ICD-10-CM | POA: Diagnosis not present

## 2018-05-29 DIAGNOSIS — Z91048 Other nonmedicinal substance allergy status: Secondary | ICD-10-CM | POA: Diagnosis not present

## 2018-05-29 DIAGNOSIS — R109 Unspecified abdominal pain: Secondary | ICD-10-CM | POA: Diagnosis not present

## 2018-05-29 DIAGNOSIS — K449 Diaphragmatic hernia without obstruction or gangrene: Secondary | ICD-10-CM | POA: Diagnosis not present

## 2018-05-29 DIAGNOSIS — N132 Hydronephrosis with renal and ureteral calculous obstruction: Secondary | ICD-10-CM | POA: Diagnosis not present

## 2018-05-29 DIAGNOSIS — R1084 Generalized abdominal pain: Secondary | ICD-10-CM | POA: Diagnosis not present

## 2018-05-30 DIAGNOSIS — N132 Hydronephrosis with renal and ureteral calculous obstruction: Secondary | ICD-10-CM | POA: Diagnosis not present

## 2018-05-30 DIAGNOSIS — N201 Calculus of ureter: Secondary | ICD-10-CM | POA: Diagnosis not present

## 2018-05-30 DIAGNOSIS — Z960 Presence of urogenital implants: Secondary | ICD-10-CM | POA: Diagnosis not present

## 2018-06-03 DIAGNOSIS — N132 Hydronephrosis with renal and ureteral calculous obstruction: Secondary | ICD-10-CM | POA: Diagnosis not present

## 2018-06-20 DIAGNOSIS — E213 Hyperparathyroidism, unspecified: Secondary | ICD-10-CM | POA: Diagnosis not present

## 2018-06-20 DIAGNOSIS — L02211 Cutaneous abscess of abdominal wall: Secondary | ICD-10-CM | POA: Diagnosis not present

## 2018-07-12 DIAGNOSIS — E213 Hyperparathyroidism, unspecified: Secondary | ICD-10-CM | POA: Diagnosis not present

## 2018-07-15 DIAGNOSIS — I161 Hypertensive emergency: Secondary | ICD-10-CM | POA: Diagnosis not present

## 2018-07-15 DIAGNOSIS — R252 Cramp and spasm: Secondary | ICD-10-CM | POA: Diagnosis not present

## 2018-07-15 DIAGNOSIS — I129 Hypertensive chronic kidney disease with stage 1 through stage 4 chronic kidney disease, or unspecified chronic kidney disease: Secondary | ICD-10-CM | POA: Diagnosis not present

## 2018-07-16 DIAGNOSIS — R252 Cramp and spasm: Secondary | ICD-10-CM | POA: Diagnosis not present

## 2018-07-16 DIAGNOSIS — I161 Hypertensive emergency: Secondary | ICD-10-CM | POA: Diagnosis not present

## 2018-07-16 DIAGNOSIS — I129 Hypertensive chronic kidney disease with stage 1 through stage 4 chronic kidney disease, or unspecified chronic kidney disease: Secondary | ICD-10-CM | POA: Diagnosis not present

## 2021-01-17 DIAGNOSIS — E1143 Type 2 diabetes mellitus with diabetic autonomic (poly)neuropathy: Secondary | ICD-10-CM | POA: Diagnosis not present

## 2021-01-17 DIAGNOSIS — M5441 Lumbago with sciatica, right side: Secondary | ICD-10-CM | POA: Diagnosis not present

## 2021-01-17 DIAGNOSIS — N182 Chronic kidney disease, stage 2 (mild): Secondary | ICD-10-CM | POA: Diagnosis not present

## 2021-01-17 DIAGNOSIS — I10 Essential (primary) hypertension: Secondary | ICD-10-CM | POA: Diagnosis not present

## 2021-04-19 DIAGNOSIS — I10 Essential (primary) hypertension: Secondary | ICD-10-CM | POA: Diagnosis not present

## 2021-04-19 DIAGNOSIS — E7849 Other hyperlipidemia: Secondary | ICD-10-CM | POA: Diagnosis not present

## 2021-04-19 DIAGNOSIS — E1165 Type 2 diabetes mellitus with hyperglycemia: Secondary | ICD-10-CM | POA: Diagnosis not present

## 2021-04-19 DIAGNOSIS — Z0000 Encounter for general adult medical examination without abnormal findings: Secondary | ICD-10-CM | POA: Diagnosis not present

## 2021-07-26 DIAGNOSIS — Z0000 Encounter for general adult medical examination without abnormal findings: Secondary | ICD-10-CM | POA: Diagnosis not present

## 2021-07-26 DIAGNOSIS — N182 Chronic kidney disease, stage 2 (mild): Secondary | ICD-10-CM | POA: Diagnosis not present

## 2021-07-26 DIAGNOSIS — I10 Essential (primary) hypertension: Secondary | ICD-10-CM | POA: Diagnosis not present

## 2021-07-26 DIAGNOSIS — E1165 Type 2 diabetes mellitus with hyperglycemia: Secondary | ICD-10-CM | POA: Diagnosis not present

## 2021-07-26 DIAGNOSIS — E1143 Type 2 diabetes mellitus with diabetic autonomic (poly)neuropathy: Secondary | ICD-10-CM | POA: Diagnosis not present

## 2021-08-31 DIAGNOSIS — N2889 Other specified disorders of kidney and ureter: Secondary | ICD-10-CM | POA: Diagnosis not present

## 2021-08-31 DIAGNOSIS — R110 Nausea: Secondary | ICD-10-CM | POA: Diagnosis not present

## 2021-08-31 DIAGNOSIS — K5730 Diverticulosis of large intestine without perforation or abscess without bleeding: Secondary | ICD-10-CM | POA: Diagnosis not present

## 2021-08-31 DIAGNOSIS — E119 Type 2 diabetes mellitus without complications: Secondary | ICD-10-CM | POA: Diagnosis not present

## 2021-08-31 DIAGNOSIS — R109 Unspecified abdominal pain: Secondary | ICD-10-CM | POA: Diagnosis not present

## 2021-08-31 DIAGNOSIS — Z882 Allergy status to sulfonamides status: Secondary | ICD-10-CM | POA: Diagnosis not present

## 2021-08-31 DIAGNOSIS — R1030 Lower abdominal pain, unspecified: Secondary | ICD-10-CM | POA: Diagnosis not present

## 2021-08-31 DIAGNOSIS — Z609 Problem related to social environment, unspecified: Secondary | ICD-10-CM | POA: Diagnosis not present

## 2021-08-31 DIAGNOSIS — Z7984 Long term (current) use of oral hypoglycemic drugs: Secondary | ICD-10-CM | POA: Diagnosis not present

## 2021-08-31 DIAGNOSIS — I700 Atherosclerosis of aorta: Secondary | ICD-10-CM | POA: Diagnosis not present

## 2021-08-31 DIAGNOSIS — M549 Dorsalgia, unspecified: Secondary | ICD-10-CM | POA: Diagnosis not present

## 2021-08-31 DIAGNOSIS — I10 Essential (primary) hypertension: Secondary | ICD-10-CM | POA: Diagnosis not present

## 2021-08-31 DIAGNOSIS — K760 Fatty (change of) liver, not elsewhere classified: Secondary | ICD-10-CM | POA: Diagnosis not present

## 2021-08-31 DIAGNOSIS — F17210 Nicotine dependence, cigarettes, uncomplicated: Secondary | ICD-10-CM | POA: Diagnosis not present

## 2021-08-31 DIAGNOSIS — N200 Calculus of kidney: Secondary | ICD-10-CM | POA: Diagnosis not present

## 2021-10-11 DIAGNOSIS — Z87442 Personal history of urinary calculi: Secondary | ICD-10-CM | POA: Diagnosis not present

## 2021-10-11 DIAGNOSIS — Z5321 Procedure and treatment not carried out due to patient leaving prior to being seen by health care provider: Secondary | ICD-10-CM | POA: Diagnosis not present

## 2021-10-11 DIAGNOSIS — R109 Unspecified abdominal pain: Secondary | ICD-10-CM | POA: Diagnosis not present

## 2021-11-03 DIAGNOSIS — I10 Essential (primary) hypertension: Secondary | ICD-10-CM | POA: Diagnosis not present

## 2021-11-03 DIAGNOSIS — E1143 Type 2 diabetes mellitus with diabetic autonomic (poly)neuropathy: Secondary | ICD-10-CM | POA: Diagnosis not present

## 2021-11-03 DIAGNOSIS — N182 Chronic kidney disease, stage 2 (mild): Secondary | ICD-10-CM | POA: Diagnosis not present

## 2021-11-03 DIAGNOSIS — E669 Obesity, unspecified: Secondary | ICD-10-CM | POA: Diagnosis not present

## 2022-01-10 DIAGNOSIS — F17210 Nicotine dependence, cigarettes, uncomplicated: Secondary | ICD-10-CM | POA: Diagnosis not present

## 2022-01-10 DIAGNOSIS — R52 Pain, unspecified: Secondary | ICD-10-CM | POA: Diagnosis not present

## 2022-01-10 DIAGNOSIS — U071 COVID-19: Secondary | ICD-10-CM | POA: Diagnosis not present

## 2022-01-10 DIAGNOSIS — R69 Illness, unspecified: Secondary | ICD-10-CM | POA: Diagnosis not present

## 2022-01-10 DIAGNOSIS — Z882 Allergy status to sulfonamides status: Secondary | ICD-10-CM | POA: Diagnosis not present

## 2022-01-10 DIAGNOSIS — E119 Type 2 diabetes mellitus without complications: Secondary | ICD-10-CM | POA: Diagnosis not present

## 2022-01-10 DIAGNOSIS — R059 Cough, unspecified: Secondary | ICD-10-CM | POA: Diagnosis not present

## 2022-01-10 DIAGNOSIS — Z794 Long term (current) use of insulin: Secondary | ICD-10-CM | POA: Diagnosis not present

## 2022-01-10 DIAGNOSIS — R509 Fever, unspecified: Secondary | ICD-10-CM | POA: Diagnosis not present

## 2022-01-10 DIAGNOSIS — E785 Hyperlipidemia, unspecified: Secondary | ICD-10-CM | POA: Diagnosis not present

## 2022-01-10 DIAGNOSIS — R110 Nausea: Secondary | ICD-10-CM | POA: Diagnosis not present

## 2022-01-10 DIAGNOSIS — I10 Essential (primary) hypertension: Secondary | ICD-10-CM | POA: Diagnosis not present

## 2022-02-16 DIAGNOSIS — Z0000 Encounter for general adult medical examination without abnormal findings: Secondary | ICD-10-CM | POA: Diagnosis not present

## 2022-02-16 DIAGNOSIS — E1143 Type 2 diabetes mellitus with diabetic autonomic (poly)neuropathy: Secondary | ICD-10-CM | POA: Diagnosis not present

## 2022-02-16 DIAGNOSIS — N182 Chronic kidney disease, stage 2 (mild): Secondary | ICD-10-CM | POA: Diagnosis not present

## 2022-02-16 DIAGNOSIS — I10 Essential (primary) hypertension: Secondary | ICD-10-CM | POA: Diagnosis not present

## 2022-03-15 DIAGNOSIS — N23 Unspecified renal colic: Secondary | ICD-10-CM | POA: Diagnosis not present

## 2022-03-15 DIAGNOSIS — Z6836 Body mass index (BMI) 36.0-36.9, adult: Secondary | ICD-10-CM | POA: Diagnosis not present

## 2022-04-04 DIAGNOSIS — N200 Calculus of kidney: Secondary | ICD-10-CM | POA: Diagnosis not present

## 2022-04-04 DIAGNOSIS — R109 Unspecified abdominal pain: Secondary | ICD-10-CM | POA: Diagnosis not present

## 2022-04-04 DIAGNOSIS — Z87442 Personal history of urinary calculi: Secondary | ICD-10-CM | POA: Diagnosis not present

## 2022-04-04 DIAGNOSIS — F17210 Nicotine dependence, cigarettes, uncomplicated: Secondary | ICD-10-CM | POA: Diagnosis not present

## 2022-05-18 DIAGNOSIS — E1121 Type 2 diabetes mellitus with diabetic nephropathy: Secondary | ICD-10-CM | POA: Diagnosis not present

## 2022-05-18 DIAGNOSIS — E7849 Other hyperlipidemia: Secondary | ICD-10-CM | POA: Diagnosis not present

## 2022-05-18 DIAGNOSIS — E1143 Type 2 diabetes mellitus with diabetic autonomic (poly)neuropathy: Secondary | ICD-10-CM | POA: Diagnosis not present

## 2022-05-18 DIAGNOSIS — I10 Essential (primary) hypertension: Secondary | ICD-10-CM | POA: Diagnosis not present

## 2022-05-19 DIAGNOSIS — E1143 Type 2 diabetes mellitus with diabetic autonomic (poly)neuropathy: Secondary | ICD-10-CM | POA: Diagnosis not present

## 2022-05-19 DIAGNOSIS — N23 Unspecified renal colic: Secondary | ICD-10-CM | POA: Diagnosis not present

## 2022-05-19 DIAGNOSIS — N1831 Chronic kidney disease, stage 3a: Secondary | ICD-10-CM | POA: Diagnosis not present

## 2022-05-19 DIAGNOSIS — E1121 Type 2 diabetes mellitus with diabetic nephropathy: Secondary | ICD-10-CM | POA: Diagnosis not present

## 2022-06-19 DIAGNOSIS — J449 Chronic obstructive pulmonary disease, unspecified: Secondary | ICD-10-CM | POA: Diagnosis not present

## 2022-06-19 DIAGNOSIS — Z6837 Body mass index (BMI) 37.0-37.9, adult: Secondary | ICD-10-CM | POA: Diagnosis not present

## 2022-06-19 DIAGNOSIS — E785 Hyperlipidemia, unspecified: Secondary | ICD-10-CM | POA: Diagnosis not present

## 2022-06-19 DIAGNOSIS — Z825 Family history of asthma and other chronic lower respiratory diseases: Secondary | ICD-10-CM | POA: Diagnosis not present

## 2022-06-19 DIAGNOSIS — Z7985 Long-term (current) use of injectable non-insulin antidiabetic drugs: Secondary | ICD-10-CM | POA: Diagnosis not present

## 2022-06-19 DIAGNOSIS — Z823 Family history of stroke: Secondary | ICD-10-CM | POA: Diagnosis not present

## 2022-06-19 DIAGNOSIS — Z833 Family history of diabetes mellitus: Secondary | ICD-10-CM | POA: Diagnosis not present

## 2022-06-19 DIAGNOSIS — E039 Hypothyroidism, unspecified: Secondary | ICD-10-CM | POA: Diagnosis not present

## 2022-06-19 DIAGNOSIS — Z8249 Family history of ischemic heart disease and other diseases of the circulatory system: Secondary | ICD-10-CM | POA: Diagnosis not present

## 2022-06-19 DIAGNOSIS — Z794 Long term (current) use of insulin: Secondary | ICD-10-CM | POA: Diagnosis not present

## 2022-06-19 DIAGNOSIS — E119 Type 2 diabetes mellitus without complications: Secondary | ICD-10-CM | POA: Diagnosis not present

## 2022-06-19 DIAGNOSIS — I10 Essential (primary) hypertension: Secondary | ICD-10-CM | POA: Diagnosis not present

## 2022-08-22 DIAGNOSIS — E1121 Type 2 diabetes mellitus with diabetic nephropathy: Secondary | ICD-10-CM | POA: Diagnosis not present

## 2022-08-22 DIAGNOSIS — E1143 Type 2 diabetes mellitus with diabetic autonomic (poly)neuropathy: Secondary | ICD-10-CM | POA: Diagnosis not present

## 2022-08-22 DIAGNOSIS — E7849 Other hyperlipidemia: Secondary | ICD-10-CM | POA: Diagnosis not present

## 2022-08-22 DIAGNOSIS — I10 Essential (primary) hypertension: Secondary | ICD-10-CM | POA: Diagnosis not present

## 2022-08-31 DIAGNOSIS — R109 Unspecified abdominal pain: Secondary | ICD-10-CM | POA: Diagnosis not present

## 2022-09-19 DIAGNOSIS — M5431 Sciatica, right side: Secondary | ICD-10-CM | POA: Diagnosis not present

## 2022-09-19 DIAGNOSIS — E119 Type 2 diabetes mellitus without complications: Secondary | ICD-10-CM | POA: Diagnosis not present

## 2022-09-19 DIAGNOSIS — I10 Essential (primary) hypertension: Secondary | ICD-10-CM | POA: Diagnosis not present

## 2022-09-19 DIAGNOSIS — Z794 Long term (current) use of insulin: Secondary | ICD-10-CM | POA: Diagnosis not present

## 2022-09-19 DIAGNOSIS — Z882 Allergy status to sulfonamides status: Secondary | ICD-10-CM | POA: Diagnosis not present

## 2022-09-19 DIAGNOSIS — M549 Dorsalgia, unspecified: Secondary | ICD-10-CM | POA: Diagnosis not present

## 2022-09-19 DIAGNOSIS — Z885 Allergy status to narcotic agent status: Secondary | ICD-10-CM | POA: Diagnosis not present

## 2022-09-19 DIAGNOSIS — Z79899 Other long term (current) drug therapy: Secondary | ICD-10-CM | POA: Diagnosis not present

## 2022-09-19 DIAGNOSIS — M5441 Lumbago with sciatica, right side: Secondary | ICD-10-CM | POA: Diagnosis not present

## 2022-11-22 DIAGNOSIS — I10 Essential (primary) hypertension: Secondary | ICD-10-CM | POA: Diagnosis not present

## 2022-11-22 DIAGNOSIS — E1121 Type 2 diabetes mellitus with diabetic nephropathy: Secondary | ICD-10-CM | POA: Diagnosis not present

## 2022-11-22 DIAGNOSIS — N1832 Chronic kidney disease, stage 3b: Secondary | ICD-10-CM | POA: Diagnosis not present

## 2022-11-22 DIAGNOSIS — E038 Other specified hypothyroidism: Secondary | ICD-10-CM | POA: Diagnosis not present

## 2022-11-22 DIAGNOSIS — E1143 Type 2 diabetes mellitus with diabetic autonomic (poly)neuropathy: Secondary | ICD-10-CM | POA: Diagnosis not present

## 2022-11-22 DIAGNOSIS — E7849 Other hyperlipidemia: Secondary | ICD-10-CM | POA: Diagnosis not present

## 2023-02-28 DIAGNOSIS — Z0000 Encounter for general adult medical examination without abnormal findings: Secondary | ICD-10-CM | POA: Diagnosis not present

## 2023-02-28 DIAGNOSIS — Z6836 Body mass index (BMI) 36.0-36.9, adult: Secondary | ICD-10-CM | POA: Diagnosis not present

## 2023-04-01 DIAGNOSIS — E119 Type 2 diabetes mellitus without complications: Secondary | ICD-10-CM | POA: Diagnosis not present

## 2023-04-01 DIAGNOSIS — N200 Calculus of kidney: Secondary | ICD-10-CM | POA: Diagnosis not present

## 2023-04-01 DIAGNOSIS — I10 Essential (primary) hypertension: Secondary | ICD-10-CM | POA: Diagnosis not present

## 2023-04-01 DIAGNOSIS — R109 Unspecified abdominal pain: Secondary | ICD-10-CM | POA: Diagnosis not present

## 2023-04-01 DIAGNOSIS — Z882 Allergy status to sulfonamides status: Secondary | ICD-10-CM | POA: Diagnosis not present

## 2023-04-01 DIAGNOSIS — N2889 Other specified disorders of kidney and ureter: Secondary | ICD-10-CM | POA: Diagnosis not present

## 2023-04-01 DIAGNOSIS — I700 Atherosclerosis of aorta: Secondary | ICD-10-CM | POA: Diagnosis not present

## 2023-04-01 DIAGNOSIS — R110 Nausea: Secondary | ICD-10-CM | POA: Diagnosis not present

## 2023-04-01 DIAGNOSIS — F17210 Nicotine dependence, cigarettes, uncomplicated: Secondary | ICD-10-CM | POA: Diagnosis not present

## 2023-04-01 DIAGNOSIS — Z885 Allergy status to narcotic agent status: Secondary | ICD-10-CM | POA: Diagnosis not present

## 2023-04-01 DIAGNOSIS — Z794 Long term (current) use of insulin: Secondary | ICD-10-CM | POA: Diagnosis not present

## 2023-04-01 DIAGNOSIS — R10815 Periumbilic abdominal tenderness: Secondary | ICD-10-CM | POA: Diagnosis not present

## 2023-04-01 DIAGNOSIS — Z79899 Other long term (current) drug therapy: Secondary | ICD-10-CM | POA: Diagnosis not present

## 2023-04-01 DIAGNOSIS — Z90710 Acquired absence of both cervix and uterus: Secondary | ICD-10-CM | POA: Diagnosis not present

## 2023-04-01 DIAGNOSIS — Z7984 Long term (current) use of oral hypoglycemic drugs: Secondary | ICD-10-CM | POA: Diagnosis not present

## 2023-04-26 DIAGNOSIS — I10 Essential (primary) hypertension: Secondary | ICD-10-CM | POA: Diagnosis not present

## 2023-04-26 DIAGNOSIS — S43492A Other sprain of left shoulder joint, initial encounter: Secondary | ICD-10-CM | POA: Diagnosis not present

## 2023-04-26 DIAGNOSIS — Z6836 Body mass index (BMI) 36.0-36.9, adult: Secondary | ICD-10-CM | POA: Diagnosis not present

## 2023-04-26 DIAGNOSIS — J019 Acute sinusitis, unspecified: Secondary | ICD-10-CM | POA: Diagnosis not present

## 2023-05-30 ENCOUNTER — Emergency Department (HOSPITAL_COMMUNITY)
Admission: EM | Admit: 2023-05-30 | Discharge: 2023-05-30 | Disposition: A | Discharge status: Home / Self Care | Payer: Self-pay | Attending: Emergency Medicine | Admitting: Emergency Medicine

## 2023-05-30 ENCOUNTER — Encounter (HOSPITAL_COMMUNITY): Payer: Self-pay | Admitting: Emergency Medicine

## 2023-05-30 ENCOUNTER — Other Ambulatory Visit: Payer: Self-pay

## 2023-05-30 DIAGNOSIS — R739 Hyperglycemia, unspecified: Secondary | ICD-10-CM

## 2023-05-30 DIAGNOSIS — E860 Dehydration: Secondary | ICD-10-CM | POA: Diagnosis not present

## 2023-05-30 DIAGNOSIS — N179 Acute kidney failure, unspecified: Secondary | ICD-10-CM | POA: Diagnosis not present

## 2023-05-30 DIAGNOSIS — Z6834 Body mass index (BMI) 34.0-34.9, adult: Secondary | ICD-10-CM | POA: Diagnosis not present

## 2023-05-30 DIAGNOSIS — E1165 Type 2 diabetes mellitus with hyperglycemia: Secondary | ICD-10-CM | POA: Diagnosis not present

## 2023-05-30 DIAGNOSIS — Z7984 Long term (current) use of oral hypoglycemic drugs: Secondary | ICD-10-CM | POA: Insufficient documentation

## 2023-05-30 DIAGNOSIS — E1110 Type 2 diabetes mellitus with ketoacidosis without coma: Secondary | ICD-10-CM | POA: Diagnosis not present

## 2023-05-30 LAB — BLOOD GAS, VENOUS
Acid-Base Excess: 0.8 mmol/L (ref 0.0–2.0)
Bicarbonate: 27.1 mmol/L (ref 20.0–28.0)
Drawn by: 63466
O2 Saturation: 67.1 %
Patient temperature: 36.7
pCO2, Ven: 48 mmHg (ref 44–60)
pH, Ven: 7.35 (ref 7.25–7.43)
pO2, Ven: 36 mmHg (ref 32–45)

## 2023-05-30 LAB — BASIC METABOLIC PANEL WITH GFR
Anion gap: 15 (ref 5–15)
BUN: 28 mg/dL — ABNORMAL HIGH (ref 6–20)
CO2: 22 mmol/L (ref 22–32)
Calcium: 9.5 mg/dL (ref 8.9–10.3)
Chloride: 89 mmol/L — ABNORMAL LOW (ref 98–111)
Creatinine, Ser: 2.18 mg/dL — ABNORMAL HIGH (ref 0.44–1.00)
GFR, Estimated: 25 mL/min — ABNORMAL LOW (ref >60)
Glucose, Bld: 641 mg/dL (ref 70–99)
Potassium: 4.2 mmol/L (ref 3.5–5.1)
Sodium: 126 mmol/L — ABNORMAL LOW (ref 135–145)

## 2023-05-30 LAB — CBC WITH DIFFERENTIAL/PLATELET
Abs Immature Granulocytes: 0.03 10*3/uL (ref 0.00–0.07)
Basophils Absolute: 0 10*3/uL (ref 0.0–0.1)
Basophils Relative: 0 %
Eosinophils Absolute: 0.2 10*3/uL (ref 0.0–0.5)
Eosinophils Relative: 2 %
HCT: 41.1 % (ref 36.0–46.0)
Hemoglobin: 14.6 g/dL (ref 12.0–15.0)
Immature Granulocytes: 0 %
Lymphocytes Relative: 20 %
Lymphs Abs: 1.7 10*3/uL (ref 0.7–4.0)
MCH: 29.1 pg (ref 26.0–34.0)
MCHC: 35.5 g/dL (ref 30.0–36.0)
MCV: 81.9 fL (ref 80.0–100.0)
Monocytes Absolute: 0.4 10*3/uL (ref 0.1–1.0)
Monocytes Relative: 5 %
Neutro Abs: 6.3 10*3/uL (ref 1.7–7.7)
Neutrophils Relative %: 73 %
Platelets: 224 10*3/uL (ref 150–400)
RBC: 5.02 MIL/uL (ref 3.87–5.11)
RDW: 13.2 % (ref 11.5–15.5)
WBC: 8.7 10*3/uL (ref 4.0–10.5)
nRBC: 0 % (ref 0.0–0.2)

## 2023-05-30 LAB — URINALYSIS, ROUTINE W REFLEX MICROSCOPIC
Bilirubin Urine: NEGATIVE
Glucose, UA: >=500 mg/dL — AB
Hgb urine dipstick: NEGATIVE
Ketones, ur: NEGATIVE mg/dL
Leukocytes,Ua: NEGATIVE
Nitrite: NEGATIVE
Protein, ur: 30 mg/dL — AB
Specific Gravity, Urine: 1.015 (ref 1.005–1.030)
pH: 7 (ref 5.0–8.0)

## 2023-05-30 LAB — TROPONIN I (HIGH SENSITIVITY)
Troponin I (High Sensitivity): 35 ng/L — ABNORMAL HIGH (ref <18)
Troponin I (High Sensitivity): 36 ng/L — ABNORMAL HIGH

## 2023-05-30 LAB — CBG MONITORING, ED
Glucose-Capillary: >600 mg/dL (ref 70–99)
Glucose-Capillary: 445 mg/dL — ABNORMAL HIGH (ref 70–99)
Glucose-Capillary: 444 mg/dL — ABNORMAL HIGH (ref 70–99)
Glucose-Capillary: 420 mg/dL — ABNORMAL HIGH (ref 70–99)

## 2023-05-30 LAB — BETA-HYDROXYBUTYRIC ACID: Beta-Hydroxybutyric Acid: 0.34 mmol/L — ABNORMAL HIGH (ref 0.05–0.27)

## 2023-05-30 MED ORDER — LACTATED RINGERS IV BOLUS
2000.0000 mL | Freq: Once | INTRAVENOUS | Status: AC
  Administered 2023-05-30: 2000 mL via INTRAVENOUS

## 2023-05-30 MED ORDER — LACTATED RINGERS IV BOLUS
1000.0000 mL | Freq: Once | INTRAVENOUS | Status: AC
  Administered 2023-05-30: 1000 mL via INTRAVENOUS

## 2023-05-30 MED ORDER — INSULIN ASPART 100 UNIT/ML IJ SOLN
9.0000 [IU] | INTRAMUSCULAR | Status: AC
  Administered 2023-05-30: 9 [IU] via SUBCUTANEOUS
  Filled 2023-05-30: qty 1

## 2023-05-30 NOTE — ED Provider Notes (Signed)
 Care transferred to me.  Troponin is flat at, likely indicative of the CKD rather than ACS.  She has no chest pain.  Glucose is coming down.  She reports that she has enough blood pressure medicine and insulin and does not need a refill.  I encouraged close follow-up with PCP.  Check in Care Everywhere, creatinine is mildly increased from before and this should get better with the fluid she was given.  Will discharge as per prior plan.   Jerilynn Montenegro, MD 05/30/23 2209

## 2023-05-30 NOTE — ED Provider Notes (Signed)
 [facility] AT [facility] Provider Note   CSN: [id number] Arrival date & time: 05/30/23  1210     History  Chief Complaint  Patient presents with   Hyperglycemia    Traci Zimmerman is a 58 y.o. female.  58 year old female with a history of insulin -dependent diabetes who presents to the emergency department with increased thirst, urination, and dizziness.  Patient reports that she has not been taking her insulin  recently.  Says that she has been feeling poorly.  Has been having increased thirst and urination. Also had some nausea and vomiting yesterday.  Denies diarrhea.  No dysuria or frequency. Denies chest pain. Says that she has been very lightheaded and dizzy.  Seen at her primary doctor and had a blood sugar that was over 600 so sent in for evaluation.       Home Medications Prior to Admission medications   Medication Sig Start Date End Date Taking? Authorizing Provider  acetaminophen  (TYLENOL ) 500 MG tablet Take 500 mg by mouth every 6 (six) hours as needed.   Yes [provider]  calcium carbonate (TUMS - DOSED IN MG ELEMENTAL CALCIUM) 500 MG chewable tablet Chew by mouth. 06/04/18  Yes [provider]  fexofenadine (ALLEGRA) 180 MG tablet Take 180 mg by mouth daily. 04/26/23  Yes [provider]  furosemide  (LASIX ) 20 MG tablet Take 20 mg by mouth daily. 09/24/17  Yes [provider]  hydrALAZINE  (APRESOLINE ) 50 MG tablet Take 50 mg by mouth 2 (two) times daily. 04/09/23  Yes [provider]  levothyroxine (SYNTHROID) 200 MCG tablet Take 200 mcg by mouth every morning.   Yes [provider]  losartan (COZAAR) 100 MG tablet Take 100 mg by mouth daily. 04/09/23  Yes [provider]  metFORMIN (GLUCOPHAGE) 1000 MG tablet Take 1,000 mg by mouth daily.   Yes [provider]  NIFEdipine (PROCARDIA XL/NIFEDICAL XL) 60 MG 24 hr tablet Take 60 mg by mouth daily. 04/26/23  Yes [provider]  ondansetron  (ZOFRAN ) 4 MG tablet Take 4 mg by mouth every 6 (six) hours as needed. for nausea 10/31/17  Yes [provider]  ondansetron  (ZOFRAN -ODT) 4 MG disintegrating tablet Take 4 mg by mouth every 8 (eight) hours as needed. 04/01/23  Yes [provider]  potassium chloride SA (KLOR-CON M) 20 MEQ tablet Take 20 mEq by mouth daily. 04/09/23  Yes [provider]  rosuvastatin (CRESTOR) 40 MG tablet Take 40 mg by mouth at bedtime.   Yes [provider]  OZEMPIC, 1 MG/DOSE, 4 MG/3ML SOPN Inject 1 mg into the skin once a week. Patient not taking: Reported on 05/30/2023 03/05/23   [provider]      Allergies    Tape    Review of Systems   Review of Systems  Physical Exam Updated Vital Signs BP (!) 181/112   Pulse 99   Temp 98.4 F (36.9 C) (Oral)   Resp 18   Ht 5\' 7"  (1.702 m)   Wt 100.2 kg   SpO2 99%   BMI 34.61 kg/m  Physical Exam Vitals and nursing note reviewed.  Constitutional:      General: She is not in acute distress.    Appearance: She is well-developed.  HENT:     Head: Normocephalic and atraumatic.     Right Ear: External ear normal.     Left Ear: External ear normal.     Nose: Nose normal.  Eyes:  Extraocular Movements: Extraocular movements intact.     Conjunctiva/sclera: Conjunctivae normal.     Pupils: Pupils are equal, round, and reactive to light.  Cardiovascular:     Rate and Rhythm: Normal rate and regular rhythm.     Heart sounds: No murmur heard. Pulmonary:     Effort: Pulmonary effort is normal. No respiratory distress.     Breath sounds: Normal breath sounds.  Abdominal:     General: Abdomen is flat. There is no distension.     Palpations: Abdomen is soft. There is no mass.     Tenderness: There is no abdominal tenderness. There is no guarding.  Musculoskeletal:     Cervical back: Normal range of motion and neck supple.     Right lower leg: No edema.     Left lower leg: No edema.   Skin:    General: Skin is warm and dry.  Neurological:     Mental Status: She is alert and oriented to person, place, and time. Mental status is at baseline.  Psychiatric:        Mood and Affect: Mood normal.     ED Results / Procedures / Treatments   Labs (all labs ordered are listed, but only abnormal results are displayed) Labs Reviewed  BASIC METABOLIC PANEL WITH GFR - Abnormal; Notable for the following components:      Result Value   Sodium 126 (*)    Chloride 89 (*)    Glucose, Bld 641 (*)    BUN 28 (*)    Creatinine, Ser 2.18 (*)    GFR, Estimated 25 (*)    All other components within normal limits  BETA-HYDROXYBUTYRIC ACID - Abnormal; Notable for the following components:   Beta-Hydroxybutyric Acid 0.34 (*)    All other components within normal limits  URINALYSIS, ROUTINE W REFLEX MICROSCOPIC - Abnormal; Notable for the following components:   Color, Urine COLORLESS (*)    Glucose, UA >=500 (*)    Protein, ur 30 (*)    Bacteria, UA RARE (*)    All other components within normal limits  CBG MONITORING, ED - Abnormal; Notable for the following components:   Glucose-Capillary >600 (*)    All other components within normal limits  CBG MONITORING, ED - Abnormal; Notable for the following components:   Glucose-Capillary 445 (*)    All other components within normal limits  CBG MONITORING, ED - Abnormal; Notable for the following components:   Glucose-Capillary 444 (*)    All other components within normal limits  CBG MONITORING, ED - Abnormal; Notable for the following components:   Glucose-Capillary 420 (*)    All other components within normal limits  TROPONIN I (HIGH SENSITIVITY) - Abnormal; Notable for the following components:   Troponin I (High Sensitivity) 35 (*)    All other components within normal limits  TROPONIN I (HIGH SENSITIVITY) - Abnormal; Notable for the following components:   Troponin I (High Sensitivity) 36 (*)    All other components within  normal limits  CBC WITH DIFFERENTIAL/PLATELET  BLOOD GAS, VENOUS    EKG EKG Interpretation Date/Time:  Wednesday May 30 2023 12:22:49 EDT Ventricular Rate:  81 PR Interval:  143 QRS Duration:  95 QT Interval:  361 QTC Calculation: 419 R Axis:   83  Text Interpretation: Sinus rhythm Repol abnrm suggests ischemia, inferior leads Minimal ST elevation, anterior leads Confirmed by Shyrl Doyne [phone]) on 05/30/2023 1:58:40 PM  Radiology No results found.  Procedures Procedures  Medications Ordered in ED Medications  lactated ringers  bolus 2,000 mL (0 mLs Intravenous Stopped 05/30/23 1849)  lactated ringers  bolus 1,000 mL (0 mLs Intravenous Stopped 05/30/23 1849)  insulin  aspart (novoLOG ) injection 9 Units (9 Units Subcutaneous Given 05/30/23 1603)    ED Course/ Medical Decision Making/ A&P Clinical Course as of 06/01/23 1610  Wed May 30, 2023  1605 Signed out to Dr Aldean Amass [RP]    Clinical Course User Index [RP] Ninetta Basket, MD                                 Medical Decision Making Amount and/or Complexity of Data Reviewed Labs: ordered.  Risk Prescription drug management.   DORITA ROWLANDS is a 58 y.o. female with comorbidities that complicate the patient evaluation including insulin -dependent diabetes who presents to the emergency department with increased thirst, urination, and dizziness.   Initial Ddx:  Hyperglycemia, dehydration, AKI, DKA, HHS  MDM/Course:  Patient presents to the emergency department with symptoms of hyperglycemia and dehydration.  On arrival does appear dehydrated.  Not an extremis currently.  She was given IV fluids and was noted to have a blood sugar of 641.  Labs not reflective of DKA.  Did have an acute kidney injury.  Was given a total of 3 L of fluids and had improvement of her blood sugar to 420.  Was then given 9 units of insulin .  Upon re-evaluation was feeling much better after the fluids and requesting to go home.   Signed out to the oncoming physician awaiting repeat CBG.  Anticipate that she will be able to be discharged home.  Of note, patient had an EKG to assess for hyperkalemia when she arrived.  It did show some ST changes so troponin was sent.  Her initial troponin was 35.  Suspect that this may be due to demand and her kidney injury.  If repeat is not significantly changed feel that she likely would be suitable for discharge especially since she is not having any ischemic symptoms.  This patient presents to the ED for concern of complaints listed in HPI, this involves an extensive number of treatment options, and is a complaint that carries with it a high risk of complications and morbidity. Disposition including potential need for admission considered.   Dispo: Pending remainder of workup  Records reviewed Outpatient Clinic Notes The following labs were independently interpreted: Chemistry and show Hyperglycemia and aki I personally reviewed and interpreted cardiac monitoring: normal sinus rhythm  I personally reviewed and interpreted the pt's EKG: see above for interpretation  I have reviewed the patients home medications and made adjustments as needed  Portions of this note were generated with Dragon dictation software. Dictation errors may occur despite best attempts at proofreading.     Final Clinical Impression(s) / ED Diagnoses Final diagnoses:  Hyperglycemia  AKI (acute kidney injury) (HCC)  Dehydration    Rx / DC Orders ED Discharge Orders     None         Ninetta Basket, MD 06/01/23 [phone]

## 2023-05-30 NOTE — ED Triage Notes (Addendum)
 Pt sent in by PCP for hyperglycemia and slurred speech. Pt has not felt well since Sunday. Pt c/o of excessive thirst, frequent urination, N/V/D since yesterday. Do not know pts LKW. Pt came POV.

## 2023-05-30 NOTE — Discharge Instructions (Signed)
 It's important to follow up closely with your Primary Care Provider. Take your medicines as prescribed.   If you develop new or worsening symptoms, then return to the ER for evaluation.

## 2023-07-10 DIAGNOSIS — I10 Essential (primary) hypertension: Secondary | ICD-10-CM | POA: Diagnosis not present

## 2023-07-10 DIAGNOSIS — Z6834 Body mass index (BMI) 34.0-34.9, adult: Secondary | ICD-10-CM | POA: Diagnosis not present

## 2023-07-10 DIAGNOSIS — S43492A Other sprain of left shoulder joint, initial encounter: Secondary | ICD-10-CM | POA: Diagnosis not present

## 2023-09-05 DIAGNOSIS — E7849 Other hyperlipidemia: Secondary | ICD-10-CM | POA: Diagnosis not present

## 2023-09-05 DIAGNOSIS — N1832 Chronic kidney disease, stage 3b: Secondary | ICD-10-CM | POA: Diagnosis not present

## 2023-09-05 DIAGNOSIS — E1122 Type 2 diabetes mellitus with diabetic chronic kidney disease: Secondary | ICD-10-CM | POA: Diagnosis not present

## 2023-09-05 DIAGNOSIS — I10 Essential (primary) hypertension: Secondary | ICD-10-CM | POA: Diagnosis not present

## 2023-09-05 DIAGNOSIS — E038 Other specified hypothyroidism: Secondary | ICD-10-CM | POA: Diagnosis not present

## 2023-12-05 DIAGNOSIS — H40053 Ocular hypertension, bilateral: Secondary | ICD-10-CM | POA: Diagnosis not present

## 2023-12-05 DIAGNOSIS — H25813 Combined forms of age-related cataract, bilateral: Secondary | ICD-10-CM | POA: Diagnosis not present

## 2023-12-10 ENCOUNTER — Encounter (INDEPENDENT_AMBULATORY_CARE_PROVIDER_SITE_OTHER): Payer: Self-pay | Admitting: *Deleted

## 2023-12-14 NOTE — Progress Notes (Signed)
 Traci Zimmerman                                          MRN: [id number]   12/14/2023   The [facility] Quality Team Specialist reviewed this patient medical record for the purposes of chart review for care gap closure. The following were reviewed: chart review for care gap closure-kidney health evaluation for diabetes:eGFR  and uACR.    [facility] Quality Team

## 2023-12-21 DIAGNOSIS — Z1231 Encounter for screening mammogram for malignant neoplasm of breast: Secondary | ICD-10-CM | POA: Diagnosis not present
